# Patient Record
Sex: Female | Born: 1975 | Race: White | Hispanic: No | Marital: Married | Smoking: Former smoker
Health system: Southern US, Community
[De-identification: ages and names within clinical notes are randomized; demographics above are authoritative.]

## PROBLEM LIST (undated history)

## (undated) DIAGNOSIS — M5030 Other cervical disc degeneration, unspecified cervical region: Secondary | ICD-10-CM

## (undated) DIAGNOSIS — N809 Endometriosis, unspecified: Secondary | ICD-10-CM

## (undated) HISTORY — PX: BREAST ENHANCEMENT SURGERY: SHX7

## (undated) HISTORY — DX: Other cervical disc degeneration, unspecified cervical region: M50.30

## (undated) HISTORY — PX: RIGHT OOPHORECTOMY: SHX2359

## (undated) HISTORY — DX: Endometriosis, unspecified: N80.9

---

## 1999-05-20 HISTORY — PX: AUGMENTATION MAMMAPLASTY: SUR837

## 2006-07-06 ENCOUNTER — Ambulatory Visit: Payer: Self-pay | Admitting: Internal Medicine

## 2006-07-27 ENCOUNTER — Ambulatory Visit: Payer: Self-pay | Admitting: Obstetrics and Gynecology

## 2006-09-08 ENCOUNTER — Ambulatory Visit: Payer: Self-pay | Admitting: Obstetrics and Gynecology

## 2006-09-14 ENCOUNTER — Inpatient Hospital Stay: Payer: Self-pay | Admitting: Obstetrics and Gynecology

## 2009-10-03 ENCOUNTER — Emergency Department: Payer: Self-pay | Admitting: Emergency Medicine

## 2010-08-31 IMAGING — CT CT HEAD WITHOUT CONTRAST
2 series · 16 of 30 positions shown, 20 images · non-contrast
Comparison: none

REASON FOR EXAM: migraine / worst headache ever / pt in the [place]
COMMENTS:

PROCEDURE:     CT  - CT HEAD WITHOUT CONTRAST  - October 03, 2009  [DATE]
RESULT:     Comparison:  None
TECHNIQUE: Multiple axial images from the foramen magnum to the vertex were
obtained without IV contrast.

[Series 2: without · axial · non-contrast · 0.36mm/px · z∈[-132,-12]mm · 13 of 28 slices shown, 17 images]
[im 2/28  brain]
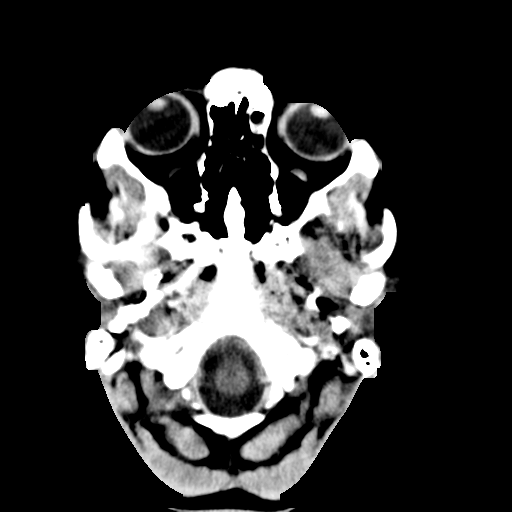
[im 2/28  bone]
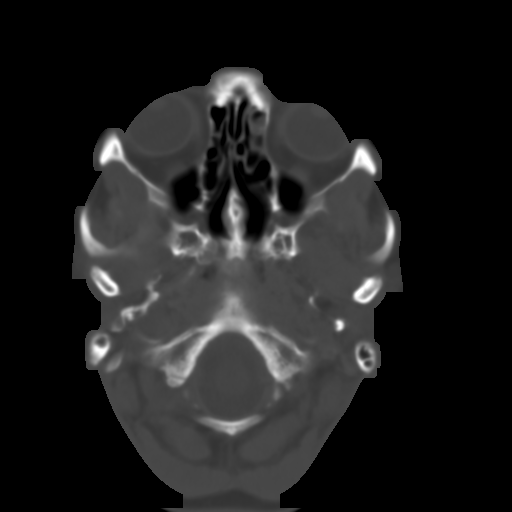
[im 4/28  brain]
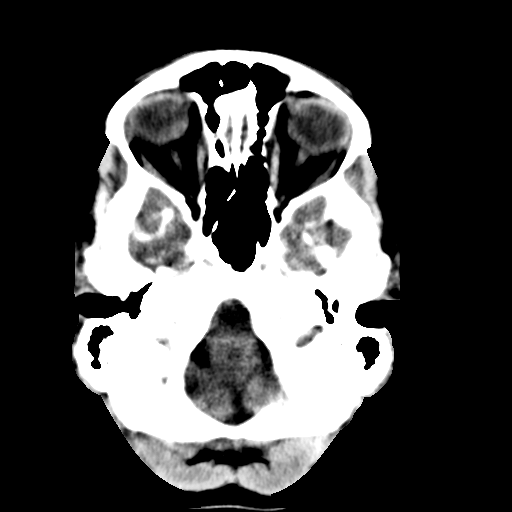
[im 6/28  brain]
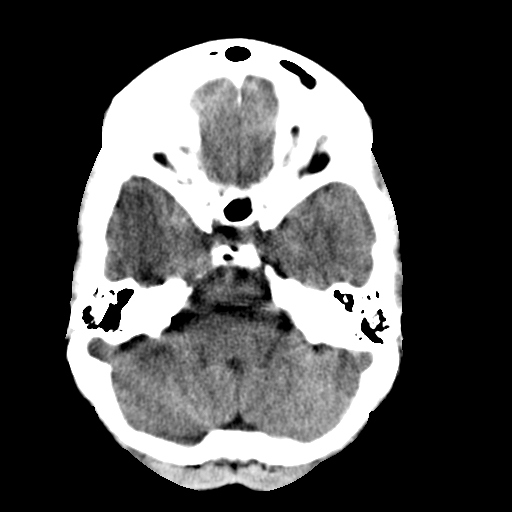
[im 8/28  brain]
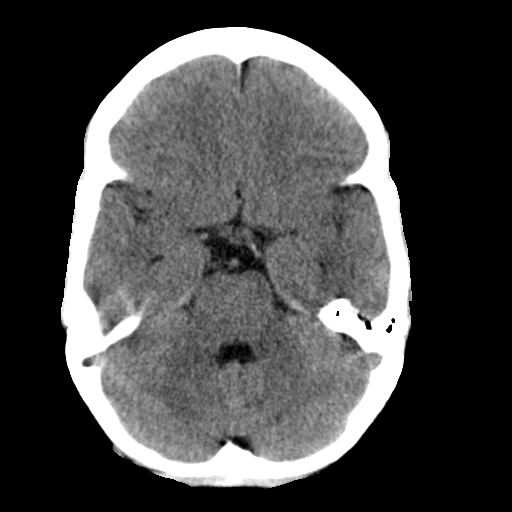
[im 10/28  brain]
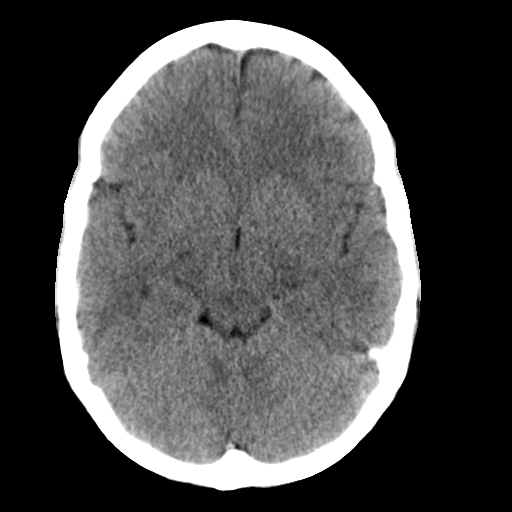
[im 10/28  bone]
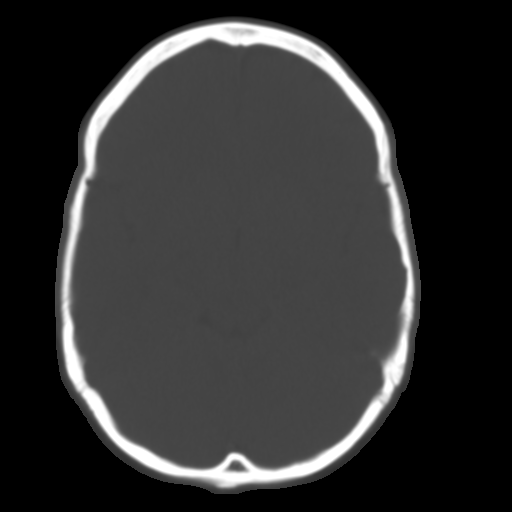
[im 12/28  brain]
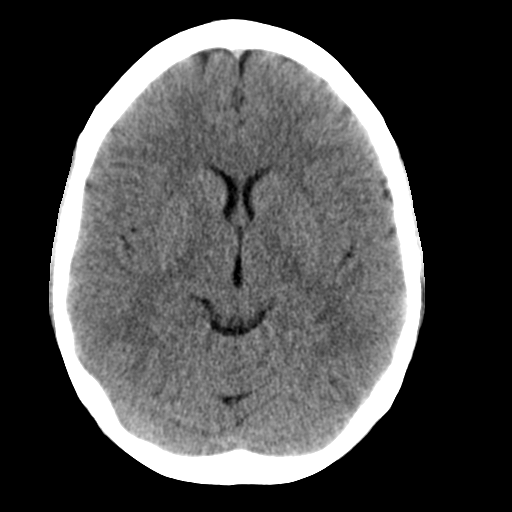
[im 14/28  brain]
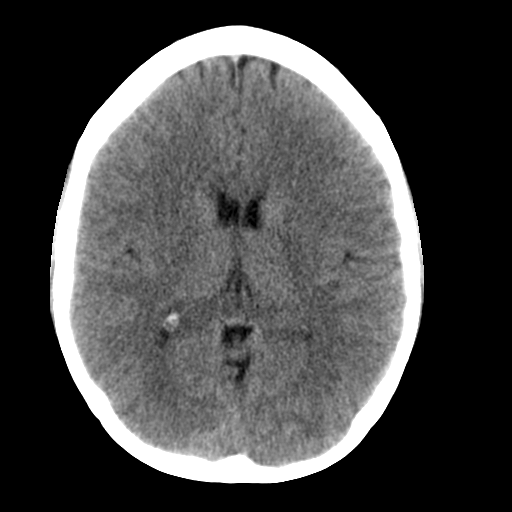
[im 16/28  brain]
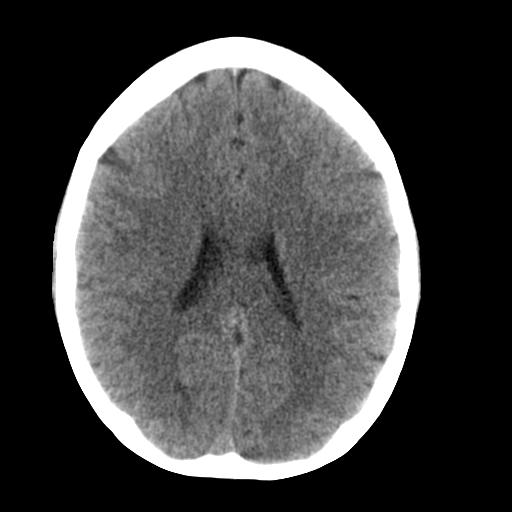
[im 18/28  brain]
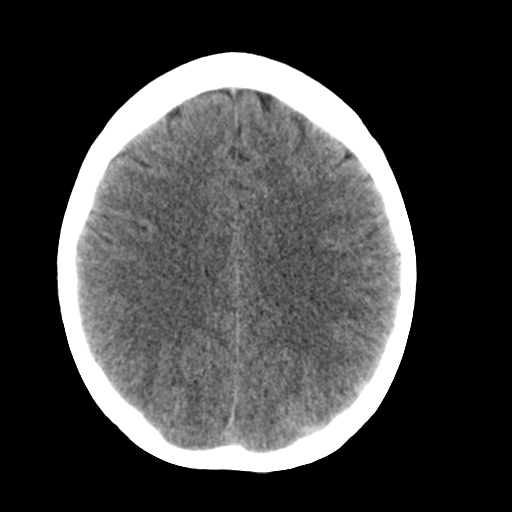
[im 18/28  bone]
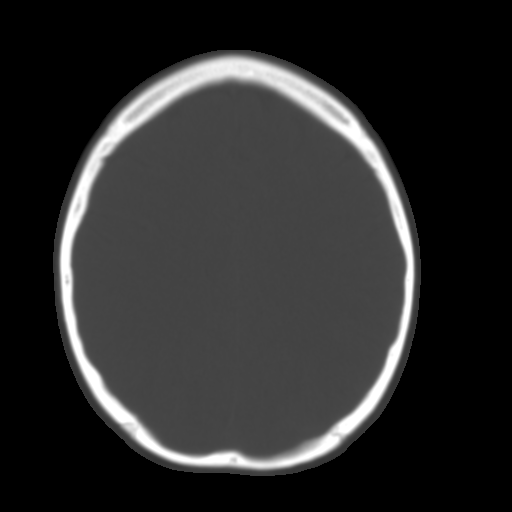
[im 20/28  brain]
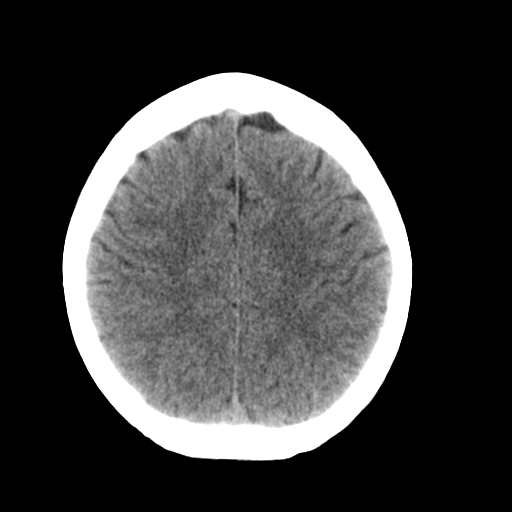
[im 22/28  brain]
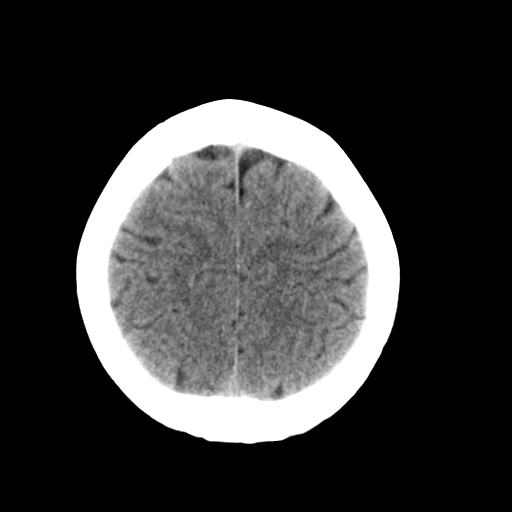
[im 24/28  brain]
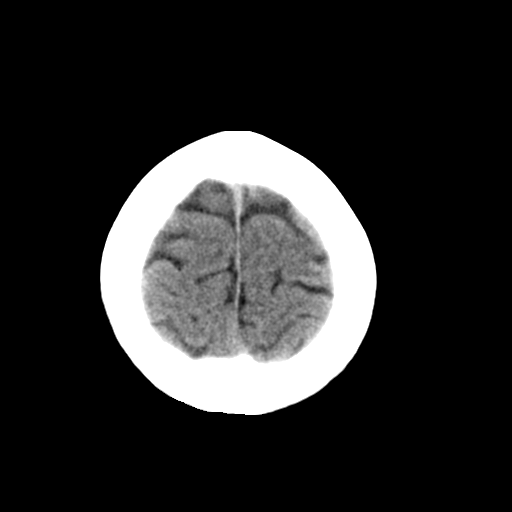
[im 26/28  brain]
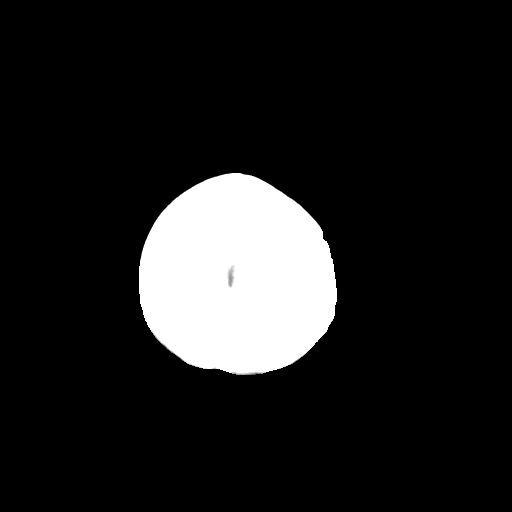
[im 26/28  bone]
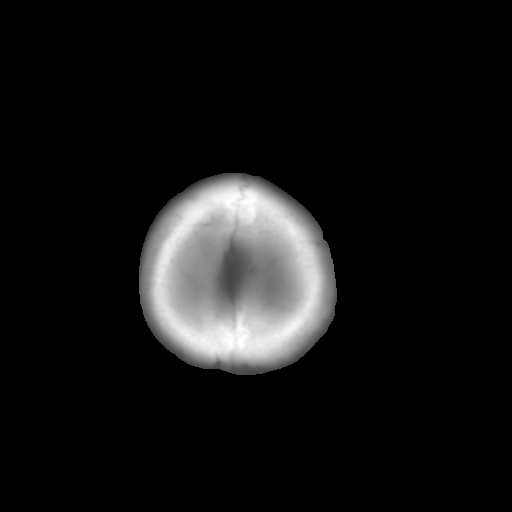

[Series 3: bone · axial · 0.36mm/px · z∈[-132,-92]mm · 3 of 28 slices shown]
[im 2/28  bone]
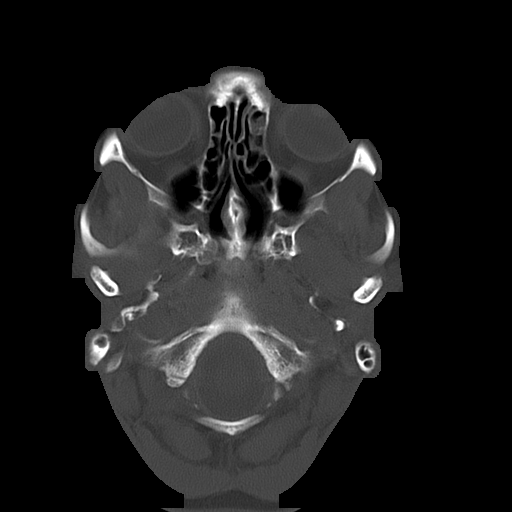
[im 6/28  bone]
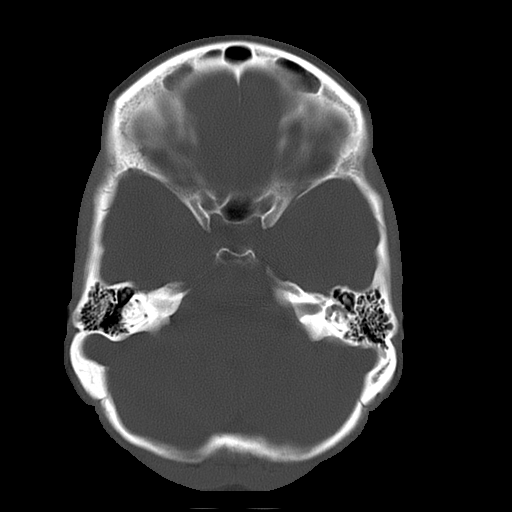
[im 10/28  bone]
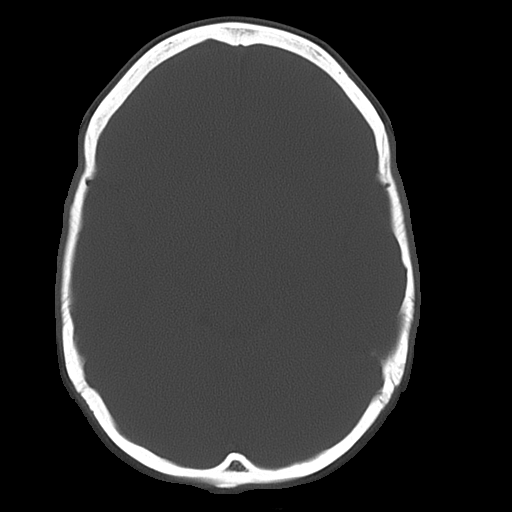

[16 of 30 positions shown; findings below may reference images not displayed]

FINDINGS: There is no evidence of mass effect, midline shift, or extra-axial fluid
collections.  There is no evidence of a space-occupying lesion or
intracranial hemorrhage. There is no evidence of a cortical-based area of
acute infarction.

The ventricles and sulci are appropriate for the patient's age. The basal
cisterns are patent.

Visualized portions of the orbits are unremarkable. The visualized portions
of the paranasal sinuses and mastoid air cells are unremarkable.

The osseous structures are unremarkable.
IMPRESSION: No acute intracranial process.

## 2014-09-12 ENCOUNTER — Ambulatory Visit
Admit: 2014-09-12 | Disposition: A | Discharge status: Home / Self Care | Payer: Self-pay | Attending: Family Medicine | Admitting: Family Medicine

## 2014-12-25 LAB — HM PAP SMEAR: HM PAP: NEGATIVE

## 2015-03-01 DIAGNOSIS — M5030 Other cervical disc degeneration, unspecified cervical region: Secondary | ICD-10-CM | POA: Insufficient documentation

## 2015-03-06 ENCOUNTER — Encounter: Payer: Self-pay | Admitting: Family Medicine

## 2015-03-06 ENCOUNTER — Ambulatory Visit (INDEPENDENT_AMBULATORY_CARE_PROVIDER_SITE_OTHER): Payer: Managed Care, Other (non HMO) | Admitting: Family Medicine

## 2015-03-06 VITALS — BP 118/75 | HR 70 | Temp 97.6°F | Ht 62.5 in | Wt 141.0 lb

## 2015-03-06 DIAGNOSIS — J342 Deviated nasal septum: Secondary | ICD-10-CM | POA: Diagnosis not present

## 2015-03-06 DIAGNOSIS — D721 Eosinophilia, unspecified: Secondary | ICD-10-CM

## 2015-03-06 DIAGNOSIS — R05 Cough: Secondary | ICD-10-CM

## 2015-03-06 DIAGNOSIS — H1010 Acute atopic conjunctivitis, unspecified eye: Secondary | ICD-10-CM | POA: Insufficient documentation

## 2015-03-06 DIAGNOSIS — R062 Wheezing: Secondary | ICD-10-CM | POA: Diagnosis not present

## 2015-03-06 DIAGNOSIS — R059 Cough, unspecified: Secondary | ICD-10-CM | POA: Insufficient documentation

## 2015-03-06 DIAGNOSIS — Z23 Encounter for immunization: Secondary | ICD-10-CM | POA: Diagnosis not present

## 2015-03-06 DIAGNOSIS — H1013 Acute atopic conjunctivitis, bilateral: Secondary | ICD-10-CM

## 2015-03-06 DIAGNOSIS — J309 Allergic rhinitis, unspecified: Secondary | ICD-10-CM | POA: Insufficient documentation

## 2015-03-06 DIAGNOSIS — J3081 Allergic rhinitis due to animal (cat) (dog) hair and dander: Secondary | ICD-10-CM | POA: Diagnosis not present

## 2015-03-06 MED ORDER — ALBUTEROL SULFATE HFA 108 (90 BASE) MCG/ACT IN AERS: 2.0000 | INHALATION_SPRAY | RESPIRATORY_TRACT | Status: DC | PRN

## 2015-03-06 MED ORDER — MONTELUKAST SODIUM 10 MG PO TABS: 10.0000 mg | ORAL_TABLET | Freq: Every day | ORAL | Status: DC

## 2015-03-06 NOTE — Assessment & Plan Note (Signed)
Encouraged patient to take daily plain antihistamine such as generic claritin or generic allegra; avoid the decongestant; start singulair; avoid triggers, including keeping animals off the bed and consider replacing carpet in the bedroom with hardwood floors; allergy testing ordered; if we can control the allergies, we should see improvement in the respiratory symptoms

## 2015-03-06 NOTE — Assessment & Plan Note (Signed)
Explained finding; would be willing to refer to ENT for evaluation, consideration of repair if not able to breathe through the right nostril well

## 2015-03-06 NOTE — Assessment & Plan Note (Signed)
Encouraged patient to take daily plain antihistamine such as generic claritin or generic allegra; avoid the decongestant; start singulair; avoid triggers; allergy testing ordered; if we can control the allergies, we should see improvement in the respiratory symptoms

## 2015-03-06 NOTE — Assessment & Plan Note (Signed)
Noted on previous CBC from 2013; mild elevation of 0.5; recheck CBC today

## 2015-03-06 NOTE — Progress Notes (Signed)
BP 118/75 mmHg  Pulse 70  Temp(Src) 97.6 F (36.4 C)  Ht 5' 2.5" (1.588 m)  Wt 141 lb (63.957 kg)  BMI 25.36 kg/m2  SpO2 97%   Subjective:    Patient ID: Sandra Wright, female    DOB: [birth year], 39 y.o.   MRN: [id number]  HPI: Sandra Wright is a 39 y.o. female  Chief Complaint  Patient presents with  . Cough    persistant cough and wheezing. Wakes her up in the middle of the night. x 3 months. Some mucus when she coughs.   She does a lot of sneezing and cough; thought it was allergies; took some allergy medicine and cold medicine Never had allergy testing done Thinking more things are bothering her, more sneezing and coughing and itchy eyes She is waking up in the middle of the night coughing, trouble breathing, lasts 1-1/2 hours; like she is trying to breathe through a small straw High-pitched wheezing sounds Never had asthma before; sounds like it She has some mucous clear in color No fevers, little bit of night sweats, gets hot when she is coughing, hot flashy Chest pain with coughing No known family hx of asthma Everybody has allergies Bedroom is carpeted; dogs (two) and a cat will nap with her, but they sleep in the crate at night; they will get up on the bed during the day; propane heat, but started before heat came on No potted plants in the bedroom No throw pillows or stuffed animals Washes sheets once a week Husband is allergic to dust mites, does a lot of dusting and vacuuming No special filters She does smoke from time to time, and that makes her symptoms worse  Relevant past medical, surgical, family and social history reviewed and updated as indicated. Interim medical history since our last visit reviewed. Allergies and medications reviewed and updated.  Review of Systems  Per HPI unless specifically indicated above     Objective:    BP 118/75 mmHg  Pulse 70  Temp(Src) 97.6 F (36.4 C)  Ht 5' 2.5" (1.588 m)  Wt 141 lb (63.957 kg)  BMI 25.36  kg/m2  SpO2 97%  Wt Readings from Last 3 Encounters:  03/06/15 141 lb (63.957 kg)  09/12/14 146 lb (66.225 kg)    Physical Exam  Constitutional: She appears well-developed and well-nourished. No distress.  HENT:  Head: Normocephalic and atraumatic.  Right Ear: Hearing, external ear and ear canal normal. Tympanic membrane is not injected, not scarred and not erythematous. A middle ear effusion is present.  Left Ear: Hearing, external ear and ear canal normal. Tympanic membrane is not injected, not scarred and not erythematous. A middle ear effusion is present.  Nose: Rhinorrhea and septal deviation (with narrowing of right nostril) present. No mucosal edema.  Mouth/Throat: Oropharynx is clear and moist and mucous membranes are normal. Mucous membranes are not dry. No oropharyngeal exudate, posterior oropharyngeal edema or posterior oropharyngeal erythema.  Eyes: Conjunctivae and EOM are normal. Right eye exhibits no discharge and no exudate. Left eye exhibits no discharge and no exudate. Right conjunctiva is not injected. Left conjunctiva is not injected. No scleral icterus.  Neck: No thyromegaly present.  Cardiovascular: Normal rate, regular rhythm and normal heart sounds.   No murmur heard. Pulmonary/Chest: Effort normal and breath sounds normal. No respiratory distress. She has no wheezes.  Abdominal: She exhibits no distension.  Musculoskeletal: Normal range of motion. She exhibits no edema.  Lymphadenopathy:  Head (right side): No submandibular adenopathy present.       Head (left side): No submandibular adenopathy present.    She has no cervical adenopathy.       Right: No supraclavicular adenopathy present.       Left: No supraclavicular adenopathy present.  Neurological: She is alert.  Skin: Skin is warm and dry. She is not diaphoretic. No pallor.  No pallor of nailbeds  Psychiatric: She has a normal mood and affect. Her behavior is normal. Judgment and thought content  normal.    No results found for this or any previous visit.    Assessment & Plan:   Problem List Items Addressed This Visit      Respiratory   Deviated nasal septum    Explained finding; would be willing to refer to ENT for evaluation, consideration of repair if not able to breathe through the right nostril well      Allergic rhinitis    Encouraged patient to take daily plain antihistamine such as generic claritin or generic allegra; avoid the decongestant; start singulair; avoid triggers, including keeping animals off the bed and consider replacing carpet in the bedroom with hardwood floors; allergy testing ordered; if we can control the allergies, we should see improvement in the respiratory symptoms      Relevant Orders   Allergens, Zone 2     Other   Wheezing - Primary    Spirometry done which was normal here; however, her history is strongly suggestive of extrinsic asthma, reactive airways disease; use SABA if needed; start singulair; return in 2 weeks and if not improving, then consider adding daily controller; decided against CXR today, discussed with patient, but will consider if symptoms persist; avoid triggers such as pets on the bed, smoking, etc.      Relevant Orders   Spirometry with graph (Completed)   Allergens, Zone 2   Needs flu shot    Flu vaccine offered and given today      Cough    Suggestive of allergic asthma, reactive airways disease; avoid smoking, keep pets off of the bed, replace carpet with hardwood floors if able; considered CXR but decided against that right now, considering lifetime radiation risk; will get CXR soon if not improving; she agrees; start singulair and SABA; return in 2 weeks      Relevant Orders   CBC with Differential/Platelet   Allergens, Zone 2   Eosinophilia    Noted on previous CBC from 2013; mild elevation of 0.5; recheck CBC today      Relevant Orders   CBC with Differential/Platelet   Allergic conjunctivitis     Encouraged patient to take daily plain antihistamine such as generic claritin or generic allegra; avoid the decongestant; start singulair; avoid triggers; allergy testing ordered; if we can control the allergies, we should see improvement in the respiratory symptoms       Other Visit Diagnoses    Encounter for immunization            Follow up plan: No Follow-up on file. Return in 2 weeks Orders Placed This Encounter  Procedures  . Flu Vaccine QUAD 36+ mos IM  . CBC with Differential/Platelet  . Allergens, Zone 2  . Spirometry with graph   Meds ordered this encounter  Medications  . montelukast (SINGULAIR) 10 MG tablet    Sig: Take 1 tablet (10 mg total) by mouth at bedtime.    Dispense:  30 tablet    Refill:  5  .  albuterol (PROVENTIL HFA) 108 (90 BASE) MCG/ACT inhaler    Sig: Inhale 2 puffs into the lungs every 4 (four) hours as needed for wheezing or shortness of breath.    Dispense:  1 Inhaler    Refill:  1   An after-visit summary was printed and given to the patient at check-out.  Please see the patient instructions which may contain other information and recommendations beyond what is mentioned above in the assessment and plan.

## 2015-03-06 NOTE — Assessment & Plan Note (Signed)
Suggestive of allergic asthma, reactive airways disease; avoid smoking, keep pets off of the bed, replace carpet with hardwood floors if able; considered CXR but decided against that right now, considering lifetime radiation risk; will get CXR soon if not improving; she agrees; start singulair and SABA; return in 2 weeks

## 2015-03-06 NOTE — Assessment & Plan Note (Signed)
Spirometry done which was normal here; however, her history is strongly suggestive of extrinsic asthma, reactive airways disease; use SABA if needed; start singulair; return in 2 weeks and if not improving, then consider adding daily controller; decided against CXR today, discussed with patient, but will consider if symptoms persist; avoid triggers such as pets on the bed, smoking, etc.

## 2015-03-06 NOTE — Assessment & Plan Note (Signed)
Flu vaccine offered and given today 

## 2015-03-07 LAB — CBC WITH DIFFERENTIAL/PLATELET
BASOS ABS: 0.1 10*3/uL (ref 0.0–0.2)
Basos: 1 %
EOS (ABSOLUTE): 1 10*3/uL — ABNORMAL HIGH (ref 0.0–0.4)
Eos: 8 %
HEMOGLOBIN: 13.3 g/dL (ref 11.1–15.9)
Hematocrit: 40.5 % (ref 34.0–46.6)
Immature Grans (Abs): 0 10*3/uL (ref 0.0–0.1)
Immature Granulocytes: 0 %
LYMPHS ABS: 4.6 10*3/uL — AB (ref 0.7–3.1)
Lymphs: 38 %
MCH: 30.9 pg (ref 26.6–33.0)
MCHC: 32.8 g/dL (ref 31.5–35.7)
MCV: 94 fL (ref 79–97)
Monocytes Absolute: 0.5 10*3/uL (ref 0.1–0.9)
Monocytes: 4 %
Neutrophils Absolute: 5.8 10*3/uL (ref 1.4–7.0)
Neutrophils: 49 %
Platelets: 436 10*3/uL — ABNORMAL HIGH (ref 150–379)
RBC: 4.31 x10E6/uL (ref 3.77–5.28)
RDW: 12.6 % (ref 12.3–15.4)
WBC: 12 10*3/uL — ABNORMAL HIGH (ref 3.4–10.8)

## 2015-03-09 LAB — ALLERGENS, ZONE 2
Alternaria Alternata IgE: <0.1 kU/L
Amer Sycamore IgE Qn: <0.1 kU/L
Aspergillus Fumigatus IgE: <0.1 kU/L
Bermuda Grass IgE: <0.1 kU/L
Cat Dander IgE: <0.1 kU/L
Cedar, Mountain IgE: <0.1 kU/L
Cladosporium Herbarum IgE: <0.1 kU/L
Cockroach, American IgE: 0.14 kU/L — AB
D001-IGE D PTERONYSSINUS: 0.2 kU/L — AB
D002-IGE D FARINAE: 0.15 kU/L — AB
Dog Dander IgE: <0.1 kU/L
Elm, American IgE: <0.1 kU/L
Johnson Grass IgE: <0.1 kU/L
Maple/Box Elder IgE: <0.1 kU/L
Penicillium Chrysogen IgE: <0.1 kU/L
Ragweed, Short IgE: <0.1 kU/L
Stemphylium Herbarum IgE: <0.1 kU/L
Sweet gum IgE RAST Ql: <0.1 kU/L
Timothy Grass IgE: <0.1 kU/L
White Mulberry IgE: <0.1 kU/L

## 2015-03-15 ENCOUNTER — Encounter: Payer: Self-pay | Admitting: Family Medicine

## 2015-03-15 ENCOUNTER — Other Ambulatory Visit: Payer: Self-pay | Admitting: Family Medicine

## 2015-03-15 DIAGNOSIS — D721 Eosinophilia, unspecified: Secondary | ICD-10-CM

## 2015-03-15 DIAGNOSIS — J3089 Other allergic rhinitis: Secondary | ICD-10-CM

## 2015-03-15 NOTE — Assessment & Plan Note (Signed)
See allergy testing; refer to allergist

## 2015-03-15 NOTE — Assessment & Plan Note (Signed)
Refer to allergist  

## 2015-09-11 ENCOUNTER — Ambulatory Visit: Payer: Managed Care, Other (non HMO) | Admitting: Family Medicine

## 2015-09-13 ENCOUNTER — Ambulatory Visit: Payer: Managed Care, Other (non HMO) | Admitting: Family Medicine

## 2015-09-17 ENCOUNTER — Encounter: Payer: Self-pay | Admitting: Family Medicine

## 2015-09-17 ENCOUNTER — Ambulatory Visit (INDEPENDENT_AMBULATORY_CARE_PROVIDER_SITE_OTHER): Payer: Managed Care, Other (non HMO) | Admitting: Family Medicine

## 2015-09-17 VITALS — BP 134/78 | HR 64 | Temp 98.0°F | Ht 63.0 in | Wt 146.0 lb

## 2015-09-17 DIAGNOSIS — N644 Mastodynia: Secondary | ICD-10-CM

## 2015-09-17 DIAGNOSIS — S248XXA Injury of other specified nerves of thorax, initial encounter: Secondary | ICD-10-CM

## 2015-09-17 DIAGNOSIS — R1900 Intra-abdominal and pelvic swelling, mass and lump, unspecified site: Secondary | ICD-10-CM

## 2015-09-17 NOTE — Patient Instructions (Signed)
Scapular Winging With Rehab Scapular winging syndrome is also known as serratus anterior palsy or long thoracic nerve injury. The condition is an uncommon injury to the nervous system. The condition is caused by injury to the long thoracic nerve that runs through the neck and shoulder. Injury to the shoulder, such as a fall or repetitive stress on the shoulder causes the nerve to become stretched. Occasionally the injury is the result of an infection of the nerve. Damage to the long thoracic nerve results in weakness of the serratus anterior muscle. The serratus anterior muscle is responsible for controlling the shoulder blade (scapula). Weakness in this muscle results in a instability (winging) of the scapula. SYMPTOMS   Pain and weakness in the shoulder (usually the back of the shoulder) that is often diffuse or unable to localize.  Loss of or decrease in shoulder function.  Upper back pain while sitting, due to the scapula pressing on the back of the chair.  Visible deformity in the back of the shoulder. CAUSES  Scapular winging is caused by stretching of the long thoracic nerve. Common mechanisms of injury include:  Viral illness.  Repetitive and/or stressful use of the shoulder.  Falling onto the shoulder with the head and neck stretched away from the shoulder. RISK INCREASES WITH:  Contact sports (football, rugby, lacrosse, or soccer).  Activities involving overhead arm movement (baseball, volleyball, or racquet sports).  Poor strength and flexibility. PREVENTION  Warm up and stretch properly before activity.  Allow for adequate recovery between workouts.  Maintain physical fitness:  Strength, flexibility, and endurance.  Cardiovascular fitness.  Learn and use proper technique. When possible, have a coach correct improper technique. PROGNOSIS  Scapular winging normally resolves spontaneously within 18 months. In rare circumstances surgery is recommended.  RELATED  COMPLICATIONS   Permanent nerve damage, including pain, numbness, tingle, or weakness.  Shoulder weakness.  Recurrent shoulder pain.  Inability to compete in athletics. TREATMENT Treatment initially involves resting from any activities that aggravate your symptoms. The use of ice and medication may help reduce pain and inflammation. The use of strengthening and stretching exercises may help reduce pain with activity, specifically shoulder exercises that improve range of motion. These exercises may be performed at home or with referral to a therapist. If symptoms persist for greater than 6 months despite non-surgical (conservative) treatment, then surgery may be recommended. Surgery is only used for the most serious cases and the purpose is to regain function, not to allow an athlete to return to sports. MEDICATION   If pain medication is necessary, then nonsteroidal anti-inflammatory medications, such as aspirin and ibuprofen, or other minor pain relievers, such as acetaminophen, are often recommended.  Do not take pain medication for 7 days before surgery.  Prescription pain relievers may be given if deemed necessary by your caregiver. Use only as directed and only as much as you need. HEAT AND COLD  Cold treatment (icing) relieves pain and reduces inflammation. Cold treatment should be applied for 10 to 15 minutes every 2 to 3 hours for inflammation and pain and immediately after any activity that aggravates your symptoms. Use ice packs or massage the area with a piece of ice (ice massage).  Heat treatment may be used prior to performing the stretching and strengthening activities prescribed by your caregiver, physical therapist, or athletic trainer. Use a heat pack or soak the injury in warm water. SEEK MEDICAL CARE IF:  Treatment seems to offer no benefit, or the condition worsens.  Any medications   produce adverse side effects. EXERCISES  RANGE OF MOTION (ROM) AND STRETCHING  EXERCISES - Scapular Winging (Serratus Anterior Palsy, Long Thoracic Nerve Injury)  These exercises may help you when beginning to rehabilitate your injury. Your symptoms may resolve with or without further involvement from your physician, physical therapist or athletic trainer. While completing these exercises, remember:   Restoring tissue flexibility helps normal motion to return to the joints. This allows healthier, less painful movement and activity.  An effective stretch should be held for at least 30 seconds.  A stretch should never be painful. You should only feel a gentle lengthening or release in the stretched tissue. ROM - Pendulum  Bend at the waist so that your right / left arm falls away from your body. Support yourself with your opposite hand on a solid surface, such as a table or a countertop.  Your right / left arm should be perpendicular to the ground. If it is not perpendicular, you need to lean over farther. Relax the muscles in your right / left arm and shoulder as much as possible.  Gently sway your hips and trunk so they move your right / left arm without any use of your right / left shoulder muscles.  Progress your movements so that your right / left arm moves side to side, then forward and backward, and finally, both clockwise and counterclockwise.  Complete __________ repetitions in each direction. Many people use this exercise to relieve discomfort in their shoulder as well as to gain range of motion. Repeat __________ times. Complete this exercise __________ times per day. STRETCH - Flexion, Seated   Sit in a firm chair so that your right / left forearm can rest on a table or on a table or countertop. Your right / left elbow should rest below the height of your shoulder so that your shoulder feels supported and not tense or uncomfortable.  Keeping your right / left shoulder relaxed, lean forward at your waist, allowing your right / left hand to slide forward. Bend  forward until you feel a moderate stretch in your shoulder, but before you feel an increase in your pain.  Hold __________ seconds. Slowly return to your starting position. Repeat __________ times. Complete this exercise __________ times per day.  STRETCH - Flexion, Standing  Stand with good posture. With an underhand grip on your right / left and an overhand grip on the opposite hand, grasp a broomstick or cane so that your hands are a little more than shoulder-width apart.  Keeping your right / left elbow straight and shoulder muscles relaxed, push the stick with your opposite hand to raise your right / left arm in front of your body and then overhead. Raise your arm until you feel a stretch in your right / left shoulder, but before you have increased shoulder pain.  Avoid shrugging your right / left shoulder as your arm rises by keeping your shoulder blade tucked down and toward your mid-back spine. Hold __________ seconds.  Slowly return to the starting position. Repeat __________ times. Complete this exercise __________ times per day. STRETCH - Abduction, Supine  Stand with good posture. With an underhand grip on your right / left and an overhand grip on the opposite hand, grasp a broomstick or cane so that your hands are a little more than shoulder-width apart.  Keeping your right / left elbow straight and shoulder muscles relaxed, push the stick with your opposite hand to raise your right / left arm out to   the side of your body and then overhead. Raise your arm until you feel a stretch in your right / left shoulder, but before you have increased shoulder pain.  Avoid shrugging your right / left shoulder as your arm rises by keeping your shoulder blade tucked down and toward your mid-back spine. Hold __________ seconds.  Slowly return to the starting position. Repeat __________ times. Complete this exercise __________ times per day. ROM - Flexion, Active-Assisted  Lie on your back.  You may bend your knees for comfort.  Grasp a broomstick or cane so your hands are about shoulder-width apart. Your right / left hand should grip the end of the stick/cane so that your hand is positioned "thumbs-up," as if you were about to shake hands.  Using your healthy arm to lead, raise your right / left arm overhead until you feel a gentle stretch in your shoulder. Hold __________ seconds.  Use the stick/cane to assist in returning your right / left arm to its starting position. Repeat __________ times. Complete this exercise __________ times per day.  STRENGTHENING EXERCISES - Scapular Winging (Serratus Anterior Palsy, Long Thoracic Nerve Injury) These exercises may help you when beginning to rehabilitate your injury. They may resolve your symptoms with or without further involvement from your physician, physical therapist or athletic trainer. While completing these exercises, remember:   Muscles can gain both the endurance and the strength needed for everyday activities through controlled exercises.  Complete these exercises as instructed by your physician, physical therapist or athletic trainer. Progress with the resistance and repetition exercises only as your caregiver advises.  You may experience muscle soreness or fatigue, but the pain or discomfort you are trying to eliminate should never worsen during these exercises. If this pain does worsen, stop and make certain you are following the directions exactly. If the pain is still present after adjustments, discontinue the exercise until you can discuss the trouble with your clinician.  During your recovery, avoid activity or exercises which involve actions that place your injured hand or elbow above your head or behind your back or head. These positions stress the tissues which are trying to heal. STRENGTH - Scapular Depression and Adduction   With good posture, sit on a firm chair. Supported your arms in front of you with pillows,  arm rests or a table top. Have your elbows in line with the sides of your body.  Gently draw your shoulder blades down and toward your mid-back spine. Gradually increase the tension without tensing the muscles along the top of your shoulders and the back of your neck.  Hold for __________ seconds. Slowly release the tension and relax your muscles completely before completing the next repetition.  After you have practiced this exercise, remove the arm support and complete it in standing as well as sitting. Repeat __________ times. Complete this exercise __________ times per day.  STRENGTH - Scapular Protractors, Standing   Stand arms-length away from a wall. Place your hands on the wall, keeping your elbows straight.  Begin by dropping your shoulder blades down and toward your mid-back spine.  To strengthen your protractors, keep your shoulder blades down, but slide them forward on your rib cage. It will feel as if you are lifting the back of your rib cage away from the wall. This is a subtle motion and can be challenging to complete. Ask your clinician for further instruction if you are not sure you are doing the exercise correctly.  Hold for __________ seconds.   Slowly return to the starting position, resting the muscles completely before completing the next repetition. Repeat __________ times. Complete this exercise __________ times per day. STRENGTH - Scapular Protractors, Supine  Lie on your back on a firm surface. Extend your right / left arm straight into the air while holding a __________ weight in your hand.  Keeping your head and back in place, lift your shoulder off the floor.  Hold __________ seconds. Slowly return to the starting position and allow your muscles to relax completely before completing the next repetition. Repeat __________ times. Complete this exercise __________ times per day. STRENGTH - Scapular Protractors, Quadruped  Get onto your hands and knees with your  shoulders directly over your hands (or as close as you comfortably can be).  Keeping your elbows locked, lift the back of your rib cage up into your shoulder blades so your mid-back rounds-out. Keep your neck muscles relaxed.  Hold this position for __________ seconds. Slowly return to the starting position and allow your muscles to relax completely before completing the next repetition. Repeat __________ times. Complete this exercise __________ times per day.  STRENGTH - Scapular Depressors  Keeping your feet on the floor, lift your bottom from the seat and lock your elbows.  Keeping your elbows straight, allow gravity to pull your body weight down. Your shoulders will rise toward your ears.  Raise your body against gravity by drawing your shoulder blades down your back, shortening the distance between your shoulders and ears. Although your feet should always maintain contact with the floor, your feet should progressively support less body weight as you get stronger.  Hold __________ seconds. In a controlled and slow manner, lower your body weight to begin the next repetition. Repeat __________ times. Complete this exercise __________ times per day.  STRENGTH - Shoulder Extensors, Prone  Lie on your stomach on a firm surface so that your right / left arm overhangs the edge. Rest your forehead on your opposite forearm. With your thumb facing away from your body and your elbow straight, hold a __________ weight in your hand.  Squeeze your right / left shoulder blade to your mid-back spine and then slowly raise your arm behind you to the height of the bed.  Hold for __________ seconds. Slowly reverse the directions and return to the starting position, controlling the weight as you lower your arm. Repeat __________ times. Complete this exercise __________ times per day.  STRENGTH - Horizontal Abductors Choose one of the two oppositions to complete this exercise. Prone: lying on stomach:  Lie  on your stomach on a firm surface so that your right / left arm overhangs the edge. Rest your forehead on your opposite forearm. With your palm facing the floor and your elbow straight, hold a __________ weight in your hand.  Squeeze your right / left shoulder blade to your mid-back spine and then slowly raise your arm to the height of the bed.  Hold for __________ seconds. Slowly reverse the directions and return to the starting position, controlling the weight as you lower your arm. Repeat __________ times. Complete this exercise __________ times per day. Standing:  Secure a rubber exercise band/tubing so that it is at the height of your shoulders when you are either standing or sitting on a firm arm-less chair.  Grasp an end of the band/tubing in each hand and have your palms face each other. Straighten your elbows and lift your hands straight in front of you at shoulder height. Step back   away from the secured end of band/tubing until it becomes tense.  Squeeze your shoulder blades together. Keeping your elbows locked and your hands at shoulder-height, bring your hands out to your side.  Hold __________ seconds. Slowly ease the tension on the band/tubing as you reverse the directions and return to the starting position. Repeat __________ times. Complete this exercise __________ times per day. STRENGTH - Scapular Retractors  Secure a rubber exercise band/tubing so that it is at the height of your shoulders when you are either standing or sitting on a firm arm-less chair.  With a palm-down grip, grasp an end of the band/tubing in each hand. Straighten your elbows and lift your hands straight in front of you at shoulder height. Step back away from the secured end of band/tubing until it becomes tense.  Squeezing your shoulder blades together, draw your elbows back as you bend them. Keep your upper arm lifted away from your body throughout the exercise.  Hold __________ seconds. Slowly ease  the tension on the band/tubing as you reverse the directions and return to the starting position. Repeat __________ times. Complete this exercise __________ times per day. STRENGTH - Shoulder Extensors   Secure a rubber exercise band/tubing so that it is at the height of your shoulders when you are either standing or sitting on a firm arm-less chair.  With a thumbs-up grip, grasp an end of the band/tubing in each hand. Straighten your elbows and lift your hands straight in front of you at shoulder height. Step back away from the secured end of band/tubing until it becomes tense.  Squeezing your shoulder blades together, pull your hands down to the sides of your thighs. Do not allow your hands to go behind you.  Hold for __________ seconds. Slowly ease the tension on the band/tubing as you reverse the directions and return to the starting position. Repeat __________ times. Complete this exercise __________ times per day.  STRENGTH - Scapular Retractors and External Rotators  Secure a rubber exercise band/tubing so that it is at the height of your shoulders when you are either standing or sitting on a firm arm-less chair.  With a palm-down grip, grasp an end of the band/tubing in each hand. Bend your elbows 90 degrees and lift your elbows to shoulder height at your sides. Step back away from the secured end of band/tubing until it becomes tense.  Squeezing your shoulder blades together, rotate your shoulder so that your upper arm and elbow remain stationary, but your fists travel upward to head-height.  Hold __________ for seconds. Slowly ease the tension on the band/tubing as you reverse the directions and return to the starting position. Repeat __________ times. Complete this exercise __________ times per day.  STRENGTH - Scapular Retractors and External Rotators, Rowing  Secure a rubber exercise band/tubing so that it is at the height of your shoulders when you are either standing or sitting  on a firm arm-less chair.  With a palm-down grip, grasp an end of the band/tubing in each hand. Straighten your elbows and lift your hands straight in front of you at shoulder height. Step back away from the secured end of band/tubing until it becomes tense.  Step 1: Squeeze your shoulder blades together. Bending your elbows, draw your hands to your chest as if you are rowing a boat. At the end of this motion, your hands and elbow should be at shoulder-height and your elbows should be out to your sides.  Step 2: Rotate your shoulder   to raise your hands above your head. Your forearms should be vertical and your upper-arms should be horizontal.  Hold for __________ seconds. Slowly ease the tension on the band/tubing as you reverse the directions and return to the starting position. Repeat __________ times. Complete this exercise __________ times per day.  STRENGTH - Scapular Retractors and Elevators  Secure a rubber exercise band/tubing so that it is at the height of your shoulders when you are either standing or sitting on a firm arm-less chair.  With a thumbs-up grip, grasp an end of the band/tubing in each hand. Step back away from the secured end of band/tubing until it becomes tense.  Squeezing your shoulder blades together, straighten your elbows and lift your hands straight over your head.  Hold for __________ seconds. Slowly ease the tension on the band/tubing as you reverse the directions and return to the starting position. Repeat __________ times. Complete this exercise __________ times per day.    This information is not intended to replace advice given to you by your health care provider. Make sure you discuss any questions you have with your health care provider.   Document Released: 05/05/2005 Document Revised: 09/19/2014 Document Reviewed: 08/17/2008 [employer] Patient Education 2016 [employer]  

## 2015-09-17 NOTE — Progress Notes (Signed)
BP 134/78 mmHg  Pulse 64  Temp(Src) 98 F (36.7 C)  Ht  (1.6 m)  Wt 146 lb (66.225 kg)  BMI 25.87 kg/m2  SpO2 97%   Subjective:    Patient ID: Sandra Wright, female    DOB: Sep 03, 1975, 40 y.o.   MRN: [id number]  HPI: Sandra Wright is a 40 y.o. female  Chief Complaint  Patient presents with  . Shoulder Pain    right shoulder; no known injury but she does lift weights  . Knot in Abdomen    has been elvauated here before, did not get u/s at the time, but would like to have it done  . Breast Pain    left breast pain burning that comes and goes x 3 months. she does have breast implants, wonders if they could be leaking   SHOULDER PAIN Duration: Since February, has been seeing Massage therapist and that has been helping, Not having pain any more but not moving the way she is supposed to  Involved shoulder: right Mechanism of injury: unknown Location: posterior Onset:sudden Severity: severe  Quality:  tingly pain Frequency: constant, eased up later that day Radiation: no Aggravating factors: nothing   Alleviating factors: massage therapy   Status: better Treatments attempted: massage therapy, ice, heat and ibuprofen  Relief with NSAIDs?:  mild Weakness: yes Numbness: yes Decreased grip strength: no Redness: no Swelling: no Bruising: no Fevers: no  ABDOMINAL ISSUES- has had some cysts in the past, has a spot on her belly that hasn't gone away and has been uncomfortable Duration: 1+ years Nature: aching occasionally Location: epigastric  Severity: mild  Radiation: no Frequency: intermittent Alleviating factors: nothing Aggravating factors: nothing Treatments attempted: none Constipation: no Diarrhea: no Mucous in the stool: no Heartburn: yes Bloating:no Flatulence: no Nausea: no Vomiting: no Melena or hematochezia: no Rash: no Jaundice: no Fever: no Weight loss: no  BREAST PAIN Duration: 3 months Location: left lower breast Onset:  gradual Severity: moderate Quality: burning Frequency: intermittent Redness: no Swelling: no Trauma: no trauma Breastfeeding: no Associated with menstral cycle: no Nipple discharge: no Breast lump: no Status: fluctuating Treatments attempted: ibuprofen Previous mammogram: no  Relevant past medical, surgical, family and social history reviewed and updated as indicated. Interim medical history since our last visit reviewed. Allergies and medications reviewed and updated.  Review of Systems  Constitutional: Negative.   Respiratory: Negative.   Cardiovascular: Negative.   Gastrointestinal: Negative for nausea, vomiting, abdominal pain, diarrhea, constipation, blood in stool, abdominal distention, anal bleeding and rectal pain.  Musculoskeletal: Positive for myalgias and arthralgias. Negative for back pain, joint swelling, gait problem, neck pain and neck stiffness.  Psychiatric/Behavioral: Negative.     Per HPI unless specifically indicated above     Objective:    BP 134/78 mmHg  Pulse 64  Temp(Src) 98 F (36.7 C)  Ht  (1.6 m)  Wt 146 lb (66.225 kg)  BMI 25.87 kg/m2  SpO2 97%  Wt Readings from Last 3 Encounters:  09/17/15 146 lb (66.225 kg)  03/06/15 141 lb (63.957 kg)  09/12/14 146 lb (66.225 kg)    Physical Exam  Constitutional: She is oriented to person, place, and time. She appears well-developed and well-nourished. No distress.  HENT:  Head: Normocephalic and atraumatic.  Right Ear: Hearing normal.  Left Ear: Hearing normal.  Nose: Nose normal.  Eyes: Conjunctivae and lids are normal. Right eye exhibits no discharge. Left eye exhibits no discharge. No scleral icterus.  Pulmonary/Chest: Effort normal.  No respiratory distress. Right breast exhibits no inverted nipple, no mass, no nipple discharge, no skin change and no tenderness. Left breast exhibits no inverted nipple, no mass, no nipple discharge, no skin change and no tenderness. Breasts are symmetrical.     Abdominal: Soft. Bowel sounds are normal. She exhibits mass (small ill-defined mass 1 inch inferior and to the R of epigastrum).  Neurological: She is alert and oriented to person, place, and time.  Skin: Skin is warm, dry and intact. No rash noted. No erythema. No pallor.  Psychiatric: She has a normal mood and affect. Her speech is normal and behavior is normal. Judgment and thought content normal. Cognition and memory are normal.  Nursing note and vitals reviewed.    Shoulder: right    Inspection: abnormal  Swelling: no   Ecchymosis: no   Erythema: no   Deformity: R scapula winging      Tenderness to Palpation:    Acromion: no    AC joint:no    Clavicle: no    Bicipital groove: no    Scapular spine: no    Coracoid process: no    Humeral head: no    Supraspinatus tendon: no     Range of Motion:     Abduction:Decreased    Adduction: Normal    Flexion: Decreased    Extension: Decreased    Internal rotation: Normal    External rotation: Normal    Painful arc: no     Muscle Strength:     Flexion: Decreased    Extension: Decreased    Abduction: Decreased    Adduction: Normal    External rotation: Normal    Internal rotation: Normal     Neuro: Sensation WNL. and Upper extremity reflexes WNL.     Special Tests:     Neer sign: Negative    Hawkins sign: Negative    Cross arm adduction: Negative    Yergason sign: Negative    O'brien sign: Negative     Speed sign: Negative       Assessment & Plan:   Problem List Items Addressed This Visit    None    Visit Diagnoses    Injury of long thoracic nerve, initial encounter    -  Primary    Exercises given today. Referral to orthopedics. Await consult.     Relevant Orders    Ambulatory referral to Orthopedic Surgery    Abdominal mass        Will obtain US. Ordered today. Await results.     Relevant Orders    US Abdomen Complete    Breast pain        Seems to be due to pec issues. Stretches for 1 month, if not  better, will refer for breast implant eval.        Follow up plan: Return in about 4 weeks (around 10/15/2015) for recheck breast pain.

## 2015-09-18 ENCOUNTER — Ambulatory Visit
Admission: RE | Admit: 2015-09-18 | Discharge: 2015-09-18 | Disposition: A | Discharge status: Home / Self Care | Payer: Managed Care, Other (non HMO) | Source: Ambulatory Visit | Attending: Family Medicine | Admitting: Family Medicine

## 2015-09-18 ENCOUNTER — Telehealth: Payer: Self-pay | Admitting: Family Medicine

## 2015-09-18 ENCOUNTER — Other Ambulatory Visit: Payer: Self-pay | Admitting: Family Medicine

## 2015-09-18 DIAGNOSIS — R1900 Intra-abdominal and pelvic swelling, mass and lump, unspecified site: Secondary | ICD-10-CM | POA: Diagnosis present

## 2015-09-18 DIAGNOSIS — K824 Cholesterolosis of gallbladder: Secondary | ICD-10-CM | POA: Insufficient documentation

## 2015-09-18 NOTE — Telephone Encounter (Signed)
Called to give her her results. Nothing deep, superficial lesion in the skin. Will see general surgery for removal if it starts bothering her.

## 2015-10-23 ENCOUNTER — Ambulatory Visit: Payer: Managed Care, Other (non HMO) | Admitting: Family Medicine

## 2016-02-18 ENCOUNTER — Ambulatory Visit (INDEPENDENT_AMBULATORY_CARE_PROVIDER_SITE_OTHER): Payer: Managed Care, Other (non HMO) | Admitting: Family Medicine

## 2016-02-18 ENCOUNTER — Encounter: Payer: Self-pay | Admitting: Family Medicine

## 2016-02-18 VITALS — BP 144/84 | HR 71 | Temp 98.7°F | Ht 62.6 in | Wt 151.2 lb

## 2016-02-18 DIAGNOSIS — F331 Major depressive disorder, recurrent, moderate: Secondary | ICD-10-CM

## 2016-02-18 DIAGNOSIS — Z23 Encounter for immunization: Secondary | ICD-10-CM | POA: Diagnosis not present

## 2016-02-18 DIAGNOSIS — F3340 Major depressive disorder, recurrent, in remission, unspecified: Secondary | ICD-10-CM | POA: Insufficient documentation

## 2016-02-18 MED ORDER — CITALOPRAM HYDROBROMIDE 20 MG PO TABS: 20.0000 mg | ORAL_TABLET | Freq: Every day | ORAL | 3 refills | Status: DC

## 2016-02-18 MED ORDER — HYDROXYZINE HCL 10 MG PO TABS: 5.0000 mg | ORAL_TABLET | Freq: Three times a day (TID) | ORAL | 1 refills | Status: DC | PRN

## 2016-02-18 NOTE — Patient Instructions (Signed)

## 2016-02-18 NOTE — Progress Notes (Signed)
BP (!) 144/84 (BP Location: Left Arm, Patient Position: Sitting, Cuff Size: Normal)   Pulse 71   Temp 98.7 F (37.1 C)   Ht 5' 2.6" (1.59 m)   Wt 151 lb 3.2 oz (68.6 kg)   LMP  (LMP Unknown)   SpO2 99%   BMI 27.13 kg/m    Subjective:    Patient ID: Sandra Wright, female    DOB: 1975/05/23, 40 y.o.   MRN: [id number]  HPI: Sandra Wright is a 40 y.o. female  Chief Complaint  Patient presents with  . Depression  . Anxiety   DEPRESSION/ANXIETY- a couple of months, seemed to come out of the blue, Feb 2015 her nephew attempted suicide Mood status: exacerbated Satisfied with current treatment?: no Symptom severity: severe  Duration of current treatment : Not on anything Psychotherapy/counseling: no  Previous psychiatric medications: zoloft Depressed mood: yes  Anxious mood: yes  Excessive worrying: yes Irritability: yes  Sweating: yes Nausea: yes Palpitations:yes Hyperventilation: no Panic attacks: yes Agoraphobia: no  Obscessions/compulsions: yes Anhedonia: no Significant weight loss or gain: yes Insomnia: yes hard to fall asleep Fatigue: yes Feelings of worthlessness or guilt: yes Impaired concentration/indecisiveness: yes Suicidal ideations: no Hopelessness: no Crying spells: yes  Recent Stressors/Life Changes: no   Relationship problems: no   Family stress: no     Financial stress: no    Job stress: no    Recent death/loss: no Depression screen PHQ 2/9 02/18/2016  Decreased Interest 2  Down, Depressed, Hopeless 2  PHQ - 2 Score 4  Altered sleeping 3  Tired, decreased energy 2  Change in appetite 3  Feeling bad or failure about yourself  3  Trouble concentrating 2  Moving slowly or fidgety/restless 2  Suicidal thoughts 0  PHQ-9 Score 19   GAD 7 : Generalized Anxiety Score 02/18/2016  Nervous, Anxious, on Edge 3  Control/stop worrying 3  Worry too much - different things 3  Trouble relaxing 3  Restless 2  Easily annoyed or irritable 2  Afraid -  awful might happen 1  Total GAD 7 Score 17  Anxiety Difficulty Very difficult    Relevant past medical, surgical, family and social history reviewed and updated as indicated. Interim medical history since our last visit reviewed. Allergies and medications reviewed and updated.  Review of Systems  Constitutional: Negative.   Respiratory: Negative.   Cardiovascular: Negative.   Psychiatric/Behavioral: Positive for decreased concentration, dysphoric mood and sleep disturbance. Negative for agitation, behavioral problems, confusion, hallucinations, self-injury and suicidal ideas. The patient is nervous/anxious. The patient is not hyperactive.     Per HPI unless specifically indicated above     Objective:    BP (!) 144/84 (BP Location: Left Arm, Patient Position: Sitting, Cuff Size: Normal)   Pulse 71   Temp 98.7 F (37.1 C)   Ht 5' 2.6" (1.59 m)   Wt 151 lb 3.2 oz (68.6 kg)   LMP  (LMP Unknown)   SpO2 99%   BMI 27.13 kg/m   Wt Readings from Last 3 Encounters:  02/18/16 151 lb 3.2 oz (68.6 kg)  09/17/15 146 lb (66.2 kg)  03/06/15 141 lb (64 kg)    Physical Exam  Constitutional: She is oriented to person, place, and time. She appears well-developed and well-nourished. No distress.  HENT:  Head: Normocephalic and atraumatic.  Right Ear: Hearing normal.  Left Ear: Hearing normal.  Nose: Nose normal.  Eyes: Conjunctivae and lids are normal. Right eye exhibits no discharge. Left  eye exhibits no discharge. No scleral icterus.  Cardiovascular: Normal rate, regular rhythm, normal heart sounds and intact distal pulses.  Exam reveals no gallop and no friction rub.   No murmur heard. Pulmonary/Chest: Effort normal and breath sounds normal. No respiratory distress. She has no wheezes. She has no rales. She exhibits no tenderness.  Musculoskeletal: Normal range of motion.  Neurological: She is alert and oriented to person, place, and time.  Skin: Skin is warm, dry and intact. No rash  noted. No erythema. No pallor.  Psychiatric: Her speech is normal and behavior is normal. Judgment and thought content normal. Her mood appears anxious. Cognition and memory are normal. She exhibits a depressed mood.  Nursing note and vitals reviewed.   Results for orders placed or performed in visit on 03/06/15  CBC with Differential/Platelet  Result Value Ref Range   WBC 12.0 (H) 3.4 - 10.8 x10E3/uL   RBC 4.31 3.77 - 5.28 x10E6/uL   Hemoglobin 13.3 11.1 - 15.9 g/dL   Hematocrit 16.140.5 09.634.0 - 46.6 %   MCV 94 79 - 97 fL   MCH 30.9 26.6 - 33.0 pg   MCHC 32.8 31.5 - 35.7 g/dL   RDW 04.512.6 40.912.3 - 81.115.4 %   Platelets 436 (H) 150 - 379 x10E3/uL   Neutrophils 49 %   Lymphs 38 %   Monocytes 4 %   Eos 8 %   Basos 1 %   Neutrophils Absolute 5.8 1.4 - 7.0 x10E3/uL   Lymphocytes Absolute 4.6 (H) 0.7 - 3.1 x10E3/uL   Monocytes Absolute 0.5 0.1 - 0.9 x10E3/uL   EOS (ABSOLUTE) 1.0 (H) 0.0 - 0.4 x10E3/uL   Basophils Absolute 0.1 0.0 - 0.2 x10E3/uL   Immature Granulocytes 0 %   Immature Grans (Abs) 0.0 0.0 - 0.1 x10E3/uL  Allergens, Zone 2  Result Value Ref Range   Class Description Comment    D Pteronyssinus IgE 0.20 (A) Class 0/I kU/L   D Farinae IgE 0.15 (A) Class 0/I kU/L   Cat Dander IgE <0.10 Class 0 kU/L   Dog Dander IgE <0.10 Class 0 kU/L   [place] Grass IgE <0.10 Class 0 kU/L   Timothy Grass IgE <0.10 Class 0 kU/L   Johnson Grass IgE <0.10 Class 0 kU/L   Bahia Grass IgE <0.10 Class 0 kU/L   Cockroach, American IgE 0.14 (A) Class 0/I kU/L   Penicillium Chrysogen IgE <0.10 Class 0 kU/L   Cladosporium Herbarum IgE <0.10 Class 0 kU/L   Aspergillus Fumigatus IgE <0.10 Class 0 kU/L   Mucor Racemosus IgE <0.10 Class 0 kU/L   Alternaria Alternata IgE <0.10 Class 0 kU/L   Stemphylium Herbarum IgE <0.10 Class 0 kU/L   Common Silver Charletta CousinBirch IgE <0.10 Class 0 kU/L   Oak, White IgE <0.10 Class 0 kU/L   Elm, American IgE <0.10 Class 0 kU/L   Maple/Box Elder IgE <0.10 Class 0 kU/L   Hickory,  White IgE <0.10 Class 0 kU/L   Amer Sycamore IgE Qn <0.10 Class 0 kU/L   White Mulberry IgE <0.10 Class 0 kU/L   Sweet gum IgE RAST Ql <0.10 Class 0 kU/L   Cedar, [place] IgE <0.10 Class 0 kU/L   Ragweed, Short IgE <0.10 Class 0 kU/L   Mugwort IgE Qn <0.10 Class 0 kU/L   Plantain, English IgE <0.10 Class 0 kU/L   Pigweed, Rough IgE <0.10 Class 0 kU/L   Sheep Sorrel IgE Qn <0.10 Class 0 kU/L   Nettle IgE <0.10 Class 0  kU/L      Assessment & Plan:   Problem List Items Addressed This Visit      Other   Moderate episode of recurrent major depressive disorder (HCC) - Primary    Will start her on celexa and hydroxyzine. List of counselors given. Follow up 4 weeks. Call with any concerns.        Relevant Medications   citalopram (CELEXA) 20 MG tablet   hydrOXYzine (ATARAX/VISTARIL) 10 MG tablet    Other Visit Diagnoses    Immunization due       Flu shot given today.   Relevant Orders   Flu Vaccine QUAD 36+ mos PF IM (Fluarix & Fluzone Quad PF) (Completed)       Follow up plan: Return in about 4 weeks (around 03/17/2016) for Follow up mood.

## 2016-02-18 NOTE — Assessment & Plan Note (Signed)
Will start her on celexa and hydroxyzine. List of counselors given. Follow up 4 weeks. Call with any concerns.

## 2016-03-17 ENCOUNTER — Encounter: Payer: Self-pay | Admitting: Family Medicine

## 2016-03-17 ENCOUNTER — Ambulatory Visit (INDEPENDENT_AMBULATORY_CARE_PROVIDER_SITE_OTHER): Payer: Managed Care, Other (non HMO) | Admitting: Family Medicine

## 2016-03-17 DIAGNOSIS — F331 Major depressive disorder, recurrent, moderate: Secondary | ICD-10-CM | POA: Diagnosis not present

## 2016-03-17 MED ORDER — CITALOPRAM HYDROBROMIDE 20 MG PO TABS: 20.0000 mg | ORAL_TABLET | Freq: Every day | ORAL | 1 refills | Status: DC

## 2016-03-17 NOTE — Assessment & Plan Note (Signed)
Under good control. Continue current regimen. Continue to monitor. Recheck 6 months. Call with any concerns.  

## 2016-03-17 NOTE — Progress Notes (Signed)
BP 133/81 (BP Location: Left Arm, Patient Position: Sitting, Cuff Size: Normal)   Pulse (!) 57   Temp 98.3 F (36.8 C)   Wt 149 lb 6.4 oz (67.8 kg)   LMP  (LMP Unknown)   SpO2 99%   BMI 26.80 kg/m    Subjective:    Patient ID: Sandra Wright, female    DOB: 14-May-1976, 40 y.o.   MRN: [id number]  HPI: Sandra Wright is a 40 y.o. female  Chief Complaint  Patient presents with  . Depression   DEPRESSION Mood status: better Satisfied with current treatment?: yes Symptom severity: mild  Duration of current treatment : 1 month Side effects: no Medication compliance: excellent compliance Psychotherapy/counseling: no  Previous psychiatric medications: celexa and hydroxyzine (didn't like hydroxyzine- made her sleepy) Depressed mood: no Anxious mood: yes Anhedonia: no Significant weight loss or gain: no Insomnia: no  Fatigue: yes Feelings of worthlessness or guilt: no Impaired concentration/indecisiveness: no Suicidal ideations: no Hopelessness: no Crying spells: no Depression screen [facility] 2/9 03/17/2016 02/18/2016  Decreased Interest 0 2  Down, Depressed, Hopeless 0 2  PHQ - 2 Score 0 4  Altered sleeping - 3  Tired, decreased energy - 2  Change in appetite - 3  Feeling bad or failure about yourself  - 3  Trouble concentrating - 2  Moving slowly or fidgety/restless - 2  Suicidal thoughts - 0  PHQ-9 Score - 19   GAD 7 : Generalized Anxiety Score 03/17/2016 02/18/2016  Nervous, Anxious, on Edge 1 3  Control/stop worrying 0 3  Worry too much - different things 0 3  Trouble relaxing 0 3  Restless 0 2  Easily annoyed or irritable 0 2  Afraid - awful might happen 0 1  Total GAD 7 Score 1 17  Anxiety Difficulty Somewhat difficult Very difficult   Relevant past medical, surgical, family and social history reviewed and updated as indicated. Interim medical history since our last visit reviewed. Allergies and medications reviewed and updated.  Review of Systems    Constitutional: Negative.   Respiratory: Negative.   Psychiatric/Behavioral: Negative.     Per HPI unless specifically indicated above     Objective:    BP 133/81 (BP Location: Left Arm, Patient Position: Sitting, Cuff Size: Normal)   Pulse (!) 57   Temp 98.3 F (36.8 C)   Wt 149 lb 6.4 oz (67.8 kg)   LMP  (LMP Unknown)   SpO2 99%   BMI 26.80 kg/m   Wt Readings from Last 3 Encounters:  03/17/16 149 lb 6.4 oz (67.8 kg)  02/18/16 151 lb 3.2 oz (68.6 kg)  09/17/15 146 lb (66.2 kg)    Physical Exam  Constitutional: She is oriented to person, place, and time. She appears well-developed and well-nourished. No distress.  HENT:  Head: Normocephalic and atraumatic.  Right Ear: Hearing normal.  Left Ear: Hearing normal.  Nose: Nose normal.  Eyes: Conjunctivae and lids are normal. Right eye exhibits no discharge. Left eye exhibits no discharge. No scleral icterus.  Pulmonary/Chest: Effort normal. No respiratory distress.  Musculoskeletal: Normal range of motion.  Neurological: She is alert and oriented to person, place, and time.  Skin: Skin is warm, dry and intact. No rash noted. She is not diaphoretic. No erythema. No pallor.  Psychiatric: She has a normal mood and affect. Her speech is normal and behavior is normal. Judgment and thought content normal. Cognition and memory are normal.  Nursing note and vitals reviewed.   Results  for orders placed or performed in visit on 03/06/15  CBC with Differential/Platelet  Result Value Ref Range   WBC 12.0 (H) 3.4 - 10.8 x10E3/uL   RBC 4.31 3.77 - 5.28 x10E6/uL   Hemoglobin 13.3 11.1 - 15.9 g/dL   Hematocrit 16.140.5 09.634.0 - 46.6 %   MCV 94 79 - 97 fL   MCH 30.9 26.6 - 33.0 pg   MCHC 32.8 31.5 - 35.7 g/dL   RDW 04.512.6 40.912.3 - 81.115.4 %   Platelets 436 (H) 150 - 379 x10E3/uL   Neutrophils 49 %   Lymphs 38 %   Monocytes 4 %   Eos 8 %   Basos 1 %   Neutrophils Absolute 5.8 1.4 - 7.0 x10E3/uL   Lymphocytes Absolute 4.6 (H) 0.7 - 3.1  x10E3/uL   Monocytes Absolute 0.5 0.1 - 0.9 x10E3/uL   EOS (ABSOLUTE) 1.0 (H) 0.0 - 0.4 x10E3/uL   Basophils Absolute 0.1 0.0 - 0.2 x10E3/uL   Immature Granulocytes 0 %   Immature Grans (Abs) 0.0 0.0 - 0.1 x10E3/uL  Allergens, Zone 2  Result Value Ref Range   Class Description Comment    D Pteronyssinus IgE 0.20 (A) Class 0/I kU/L   D Farinae IgE 0.15 (A) Class 0/I kU/L   Cat Dander IgE <0.10 Class 0 kU/L   Dog Dander IgE <0.10 Class 0 kU/L   [place] Grass IgE <0.10 Class 0 kU/L   Timothy Grass IgE <0.10 Class 0 kU/L   Johnson Grass IgE <0.10 Class 0 kU/L   Bahia Grass IgE <0.10 Class 0 kU/L   Cockroach, American IgE 0.14 (A) Class 0/I kU/L   Penicillium Chrysogen IgE <0.10 Class 0 kU/L   Cladosporium Herbarum IgE <0.10 Class 0 kU/L   Aspergillus Fumigatus IgE <0.10 Class 0 kU/L   Mucor Racemosus IgE <0.10 Class 0 kU/L   Alternaria Alternata IgE <0.10 Class 0 kU/L   Stemphylium Herbarum IgE <0.10 Class 0 kU/L   Common Silver Charletta CousinBirch IgE <0.10 Class 0 kU/L   Oak, White IgE <0.10 Class 0 kU/L   Elm, American IgE <0.10 Class 0 kU/L   Maple/Box Elder IgE <0.10 Class 0 kU/L   Hickory, White IgE <0.10 Class 0 kU/L   Amer Sycamore IgE Qn <0.10 Class 0 kU/L   White Mulberry IgE <0.10 Class 0 kU/L   Sweet gum IgE RAST Ql <0.10 Class 0 kU/L   Cedar, [place] IgE <0.10 Class 0 kU/L   Ragweed, Short IgE <0.10 Class 0 kU/L   Mugwort IgE Qn <0.10 Class 0 kU/L   Plantain, English IgE <0.10 Class 0 kU/L   Pigweed, Rough IgE <0.10 Class 0 kU/L   Sheep Sorrel IgE Qn <0.10 Class 0 kU/L   Nettle IgE <0.10 Class 0 kU/L      Assessment & Plan:   Problem List Items Addressed This Visit      Other   Moderate episode of recurrent major depressive disorder (HCC)    Under good control. Continue current regimen. Continue to monitor. Recheck 6 months. Call with any concerns.       Relevant Medications   citalopram (CELEXA) 20 MG tablet    Other Visit Diagnoses   None.      Follow up  plan: Return in about 6 months (around 09/15/2016) for Physical/Follow up mood.

## 2016-03-17 NOTE — Patient Instructions (Signed)
Head space Calm Aura

## 2016-05-15 ENCOUNTER — Other Ambulatory Visit: Payer: Self-pay | Admitting: Family Medicine

## 2016-06-27 ENCOUNTER — Other Ambulatory Visit: Payer: Self-pay | Admitting: Obstetrics and Gynecology

## 2016-06-27 DIAGNOSIS — Z1231 Encounter for screening mammogram for malignant neoplasm of breast: Secondary | ICD-10-CM

## 2016-08-01 ENCOUNTER — Ambulatory Visit
Admission: RE | Admit: 2016-08-01 | Discharge: 2016-08-01 | Disposition: A | Discharge status: Home / Self Care | Payer: Managed Care, Other (non HMO) | Source: Ambulatory Visit | Attending: Obstetrics and Gynecology | Admitting: Obstetrics and Gynecology

## 2016-08-01 ENCOUNTER — Other Ambulatory Visit: Payer: Self-pay | Admitting: Obstetrics and Gynecology

## 2016-08-01 DIAGNOSIS — Z9882 Breast implant status: Secondary | ICD-10-CM | POA: Diagnosis not present

## 2016-08-01 DIAGNOSIS — Z1231 Encounter for screening mammogram for malignant neoplasm of breast: Secondary | ICD-10-CM

## 2016-09-18 ENCOUNTER — Encounter: Payer: Self-pay | Admitting: Family Medicine

## 2016-09-18 ENCOUNTER — Ambulatory Visit (INDEPENDENT_AMBULATORY_CARE_PROVIDER_SITE_OTHER): Payer: Managed Care, Other (non HMO) | Admitting: Family Medicine

## 2016-09-18 VITALS — BP 128/82 | HR 87 | Temp 98.2°F | Ht 62.5 in | Wt 141.3 lb

## 2016-09-18 DIAGNOSIS — F1021 Alcohol dependence, in remission: Secondary | ICD-10-CM | POA: Diagnosis not present

## 2016-09-18 DIAGNOSIS — J301 Allergic rhinitis due to pollen: Secondary | ICD-10-CM

## 2016-09-18 DIAGNOSIS — Z0000 Encounter for general adult medical examination without abnormal findings: Secondary | ICD-10-CM | POA: Diagnosis not present

## 2016-09-18 DIAGNOSIS — F4010 Social phobia, unspecified: Secondary | ICD-10-CM | POA: Diagnosis not present

## 2016-09-18 DIAGNOSIS — F3340 Major depressive disorder, recurrent, in remission, unspecified: Secondary | ICD-10-CM

## 2016-09-18 LAB — UA/M W/RFLX CULTURE, ROUTINE
Bilirubin, UA: NEGATIVE
Glucose, UA: NEGATIVE
KETONES UA: NEGATIVE
LEUKOCYTES UA: NEGATIVE
NITRITE UA: NEGATIVE
PROTEIN UA: NEGATIVE
SPEC GRAV UA: 1.03 (ref 1.005–1.030)
UUROB: 1 mg/dL (ref 0.2–1.0)
pH, UA: 5.5 (ref 5.0–7.5)

## 2016-09-18 LAB — MICROSCOPIC EXAMINATION: WBC, UA: NONE SEEN /hpf (ref 0–?)

## 2016-09-18 MED ORDER — CITALOPRAM HYDROBROMIDE 20 MG PO TABS: 20.0000 mg | ORAL_TABLET | Freq: Every day | ORAL | 1 refills | Status: DC

## 2016-09-18 MED ORDER — BUSPIRONE HCL 5 MG PO TABS: 5.0000 mg | ORAL_TABLET | Freq: Three times a day (TID) | ORAL | 2 refills | Status: DC

## 2016-09-18 MED ORDER — NALTREXONE HCL 50 MG PO TABS: ORAL_TABLET | ORAL | 1 refills | Status: DC

## 2016-09-18 MED ORDER — MONTELUKAST SODIUM 10 MG PO TABS: 10.0000 mg | ORAL_TABLET | Freq: Every day | ORAL | 1 refills | Status: DC

## 2016-09-18 NOTE — Assessment & Plan Note (Signed)
Will start buspar to be taken PRN before anxiety provoking events. Recheck 2 months.

## 2016-09-18 NOTE — Assessment & Plan Note (Signed)
Will start naltrexone to help with cravings. Rx sent to her pharmacy. Recheck 2 months. Continue to follow with AA.

## 2016-09-18 NOTE — Patient Instructions (Addendum)
Health Maintenance, Female Adopting a healthy lifestyle and getting preventive care can go a long way to promote health and wellness. Talk with your health care provider about what schedule of regular examinations is right for you. This is a good chance for you to check in with your provider about disease prevention and staying healthy. In between checkups, there are plenty of things you can do on your own. Experts have done a lot of research about which lifestyle changes and preventive measures are most likely to keep you healthy. Ask your health care provider for more information. Weight and diet Eat a healthy diet  Be sure to include plenty of vegetables, fruits, low-fat dairy products, and lean protein.  Do not eat a lot of foods high in solid fats, added sugars, or salt.  Get regular exercise. This is one of the most important things you can do for your health.  Most adults should exercise for at least 150 minutes each week. The exercise should increase your heart rate and make you sweat (moderate-intensity exercise).  Most adults should also do strengthening exercises at least twice a week. This is in addition to the moderate-intensity exercise. Maintain a healthy weight  Body mass index (BMI) is a measurement that can be used to identify possible weight problems. It estimates body fat based on height and weight. Your health care provider can help determine your BMI and help you achieve or maintain a healthy weight.  For females 76 years of age and older:  A BMI below 18.5 is considered underweight.  A BMI of 18.5 to 24.9 is normal.  A BMI of 25 to 29.9 is considered overweight.  A BMI of 30 and above is considered obese. Watch levels of cholesterol and blood lipids  You should start having your blood tested for lipids and cholesterol at 40 years of age, then have this test every 5 years.  You may need to have your cholesterol levels checked more often if:  Your lipid or  cholesterol levels are high.  You are older than 40 years of age.  You are at high risk for heart disease. Cancer screening Lung Cancer  Lung cancer screening is recommended for adults 64-42 years old who are at high risk for lung cancer because of a history of smoking.  A yearly low-dose CT scan of the lungs is recommended for people who:  Currently smoke.  Have quit within the past 15 years.  Have at least a 30-pack-year history of smoking. A pack year is smoking an average of one pack of cigarettes a day for 1 year.  Yearly screening should continue until it has been 15 years since you quit.  Yearly screening should stop if you develop a health problem that would prevent you from having lung cancer treatment. Breast Cancer  Practice breast self-awareness. This means understanding how your breasts normally appear and feel.  It also means doing regular breast self-exams. Let your health care provider know about any changes, no matter how small.  If you are in your 20s or 30s, you should have a clinical breast exam (CBE) by a health care provider every 1-3 years as part of a regular health exam.  If you are 34 or older, have a CBE every year. Also consider having a breast X-ray (mammogram) every year.  If you have a family history of breast cancer, talk to your health care provider about genetic screening.  If you are at high risk for breast cancer, talk  to your health care provider about having an MRI and a mammogram every year.  Breast cancer gene (BRCA) assessment is recommended for women who have family members with BRCA-related cancers. BRCA-related cancers include:  Breast.  Ovarian.  Tubal.  Peritoneal cancers.  Results of the assessment will determine the need for genetic counseling and BRCA1 and BRCA2 testing. Cervical Cancer  Your health care provider may recommend that you be screened regularly for cancer of the pelvic organs (ovaries, uterus, and vagina).  This screening involves a pelvic examination, including checking for microscopic changes to the surface of your cervix (Pap test). You may be encouraged to have this screening done every 3 years, beginning at age 24.  For women ages 66-65, health care providers may recommend pelvic exams and Pap testing every 3 years, or they may recommend the Pap and pelvic exam, combined with testing for human papilloma virus (HPV), every 5 years. Some types of HPV increase your risk of cervical cancer. Testing for HPV may also be done on women of any age with unclear Pap test results.  Other health care providers may not recommend any screening for nonpregnant women who are considered low risk for pelvic cancer and who do not have symptoms. Ask your health care provider if a screening pelvic exam is right for you.  If you have had past treatment for cervical cancer or a condition that could lead to cancer, you need Pap tests and screening for cancer for at least 20 years after your treatment. If Pap tests have been discontinued, your risk factors (such as having a new sexual partner) need to be reassessed to determine if screening should resume. Some women have medical problems that increase the chance of getting cervical cancer. In these cases, your health care provider may recommend more frequent screening and Pap tests. Colorectal Cancer  This type of cancer can be detected and often prevented.  Routine colorectal cancer screening usually begins at 40 years of age and continues through 40 years of age.  Your health care provider may recommend screening at an earlier age if you have risk factors for colon cancer.  Your health care provider may also recommend using home test kits to check for hidden blood in the stool.  A small camera at the end of a tube can be used to examine your colon directly (sigmoidoscopy or colonoscopy). This is done to check for the earliest forms of colorectal cancer.  Routine  screening usually begins at age 41.  Direct examination of the colon should be repeated every 5-10 years through 40 years of age. However, you may need to be screened more often if early forms of precancerous polyps or small growths are found. Skin Cancer  Check your skin from head to toe regularly.  Tell your health care provider about any new moles or changes in moles, especially if there is a change in a mole's shape or color.  Also tell your health care provider if you have a mole that is larger than the size of a pencil eraser.  Always use sunscreen. Apply sunscreen liberally and repeatedly throughout the day.  Protect yourself by wearing long sleeves, pants, a wide-brimmed hat, and sunglasses whenever you are outside. Heart disease, diabetes, and high blood pressure  High blood pressure causes heart disease and increases the risk of stroke. High blood pressure is more likely to develop in:  People who have blood pressure in the high end of the normal range (130-139/85-89 mm Hg).  People who are overweight or obese.  People who are African American.  If you are 59-24 years of age, have your blood pressure checked every 3-5 years. If you are 34 years of age or older, have your blood pressure checked every year. You should have your blood pressure measured twice-once when you are at a hospital or clinic, and once when you are not at a hospital or clinic. Record the average of the two measurements. To check your blood pressure when you are not at a hospital or clinic, you can use:  An automated blood pressure machine at a pharmacy.  A home blood pressure monitor.  If you are between 29 years and 60 years old, ask your health care provider if you should take aspirin to prevent strokes.  Have regular diabetes screenings. This involves taking a blood sample to check your fasting blood sugar level.  If you are at a normal weight and have a low risk for diabetes, have this test once  every three years after 40 years of age.  If you are overweight and have a high risk for diabetes, consider being tested at a younger age or more often. Preventing infection Hepatitis B  If you have a higher risk for hepatitis B, you should be screened for this virus. You are considered at high risk for hepatitis B if:  You were born in a country where hepatitis B is common. Ask your health care provider which countries are considered high risk.  Your parents were born in a high-risk country, and you have not been immunized against hepatitis B (hepatitis B vaccine).  You have HIV or AIDS.  You use needles to inject street drugs.  You live with someone who has hepatitis B.  You have had sex with someone who has hepatitis B.  You get hemodialysis treatment.  You take certain medicines for conditions, including cancer, organ transplantation, and autoimmune conditions. Hepatitis C  Blood testing is recommended for:  Everyone born from [birth year] through 1964.  Anyone with known risk factors for hepatitis C. Sexually transmitted infections (STIs)  You should be screened for sexually transmitted infections (STIs) including gonorrhea and chlamydia if:  You are sexually active and are younger than 40 years of age.  You are older than 40 years of age and your health care provider tells you that you are at risk for this type of infection.  Your sexual activity has changed since you were last screened and you are at an increased risk for chlamydia or gonorrhea. Ask your health care provider if you are at risk.  If you do not have HIV, but are at risk, it may be recommended that you take a prescription medicine daily to prevent HIV infection. This is called pre-exposure prophylaxis (PrEP). You are considered at risk if:  You are sexually active and do not regularly use condoms or know the HIV status of your partner(s).  You take drugs by injection.  You are sexually active with a partner  who has HIV. Talk with your health care provider about whether you are at high risk of being infected with HIV. If you choose to begin PrEP, you should first be tested for HIV. You should then be tested every 3 months for as long as you are taking PrEP. Pregnancy  If you are premenopausal and you may become pregnant, ask your health care provider about preconception counseling.  If you may become pregnant, take 400 to 800 micrograms (mcg) of folic acid  every day.  If you want to prevent pregnancy, talk to your health care provider about birth control (contraception). Osteoporosis and menopause  Osteoporosis is a disease in which the bones lose minerals and strength with aging. This can result in serious bone fractures. Your risk for osteoporosis can be identified using a bone density scan.  If you are 25 years of age or older, or if you are at risk for osteoporosis and fractures, ask your health care provider if you should be screened.  Ask your health care provider whether you should take a calcium or vitamin D supplement to lower your risk for osteoporosis.  Menopause may have certain physical symptoms and risks.  Hormone replacement therapy may reduce some of these symptoms and risks. Talk to your health care provider about whether hormone replacement therapy is right for you. Follow these instructions at home:  Schedule regular health, dental, and eye exams.  Stay current with your immunizations.  Do not use any tobacco products including cigarettes, chewing tobacco, or electronic cigarettes.  If you are pregnant, do not drink alcohol.  If you are breastfeeding, limit how much and how often you drink alcohol.  Limit alcohol intake to no more than 1 drink per day for nonpregnant women. One drink equals 12 ounces of beer, 5 ounces of wine, or 1 ounces of hard liquor.  Do not use street drugs.  Do not share needles.  Ask your health care provider for help if you need support  or information about quitting drugs.  Tell your health care provider if you often feel depressed.  Tell your health care provider if you have ever been abused or do not feel safe at home. This information is not intended to replace advice given to you by your health care provider. Make sure you discuss any questions you have with your health care provider. Document Released: 11/18/2010 Document Revised: 10/11/2015 Document Reviewed: 02/06/2015 Elsevier Interactive Patient Education  2017 [employer].  Achilles Tendon Rupture, Phase I Rehab Ask your health care provider which exercises are safe for you. Do exercises exactly as told by your health care provider and adjust them as directed. It is normal to feel mild stretching, pulling, tightness, or discomfort as you do these exercises, but you should stop right away if you feel sudden pain or your pain gets worse.Do not begin these exercises until told by your health care provider. Stretching and range of motion exercises These exercises warm up your muscles and joints and improve the movement and flexibility of your lower leg and heel. These exercises also help to relieve pain, numbness, and tingling. Exercise A: Dorsiflexion and plantar flexion, active range of motion   1. Sit with your left / right knee straight or bent. Do not rest your foot on anything. 2. Flex your left / right ankle to tilt the top of your foot toward your shin. Stop at the first point of resistance or at the angle where your health care provider told you to stop. 3. Hold this position for __________ seconds. 4. Point your toes downward to tilt the top of your foot away from your shin. 5. Hold this position for __________ seconds. Repeat __________ times. Complete this exercise __________ times a day. Exercise B: Ankle alphabet   1. Sit with your left / right leg supported at the lower leg.  Do not rest your foot on anything.  Make sure your foot has room to move  freely. 2. Think of your left /  right foot as a paintbrush, and move your foot to trace each letter of the alphabet in the air. Keep your hip and knee still while you trace. 3. Trace every letter from A to Z. Repeat __________ times. Complete this exercise __________ times a day. Strengthening exercises These exercises build strength and endurance in your lower leg. Endurance is the ability to use your muscles for a long time, even after they get tired. Exercise C: Dorsiflexion   1. Secure a rubber exercise band or tube to an object, such as a table leg, that will stay still when the band is pulled. Secure the other end around your left / right foot. 2. Sit on the floor facing the object with your left / right leg extended and your toes pointing down. The band or tube should be slightly tense when your foot is relaxed. 3. Slowly flex your left / right ankle and toes to bring your foot toward you. Stop when you feel resistance in your Achilles tendon or stop at the angle where your health care provider told you to stop. 4. Hold this position for __________ seconds. 5. Slowly return your foot to the starting position. Repeat __________ times. Complete this exercise __________ times a day. Exercise D: Eversion  1. Sit on the floor with your legs straight out in front of you. 2. Loop a rubber exercise band around your left / right foot, across the top part of the walking surface (ball) of that foot. Hold the band in your hands or secure it to a stable object. 3. Slowly push your foot outward, away from your other leg. 4. Hold this position for __________ seconds. 5. Slowly return to the starting position. Repeat __________ times. Complete this exercise __________ times a day. Exercise E: Inversion  1. Sit on the floor with your legs straight out in front of you. 2. Loop a rubber exercise band around your left / right foot, across the top part of the walking surface (ball) of that foot. Hold the  band in your hands or secure it to a stable object. 3. Slowly push your foot inward, toward your other leg. 4. Hold this position for __________ seconds. 5. Slowly return your foot to the starting position. Repeat __________ times. Complete this exercise __________ times a day. Exercise F: Plantar flexion, seated   1. Sit on a chair with your feet flat on the floor. 2. If told by your health care provider, place __________ lb. on your left / right knee. 3. Keeping your toes firmly on the floor, lift your left / right heel as far as you can without increasing any discomfort in your ankle. 4. Bring your heel back down to the floor. Repeat __________ times. Complete this exercise __________ times a day. This information is not intended to replace advice given to you by your health care provider. Make sure you discuss any questions you have with your health care provider. Document Released: 05/05/2005 Document Revised: 01/10/2016 Document Reviewed: 03/28/2015 Elsevier Interactive Patient Education  2017 [employer].

## 2016-09-18 NOTE — Progress Notes (Addendum)
BP 128/82 (BP Location: Left Arm, Patient Position: Sitting, Cuff Size: Normal)   Pulse 87   Temp 98.2 F (36.8 C)   Ht 5' 2.5" (1.588 m)   Wt 141 lb 4.8 oz (64.1 kg)   SpO2 98%   BMI 25.43 kg/m    Subjective:    Patient ID: Sandra Wright, female    DOB: 10/01/1975, 41 y.o.   MRN: [id number]  HPI: Sandra Wright is a 41 y.o. female presenting on 09/18/2016 for comprehensive medical examination. Current medical complaints include:  Has been feeling really anxious when going into social situations. Would like something to take during that time. Also going to AA- 3 days in recovery. Working really hard. Would like something to help with the cravings.   DEPRESSION Mood status: stable Satisfied with current treatment?: no Symptom severity: mild  Duration of current treatment : chronic Side effects: no Medication compliance: excellent compliance Psychotherapy/counseling: no  Previous psychiatric medications: celexa Depressed mood: no Anxious mood: yes Anhedonia: no Significant weight loss or gain: no Insomnia: no  Fatigue: yes Feelings of worthlessness or guilt: no Impaired concentration/indecisiveness: no Suicidal ideations: no Hopelessness: no Crying spells: no Depression screen [facility] 2/9 09/18/2016 03/17/2016 02/18/2016  Decreased Interest 1 0 2  Down, Depressed, Hopeless 0 0 2  PHQ - 2 Score 1 0 4  Altered sleeping - - 3  Tired, decreased energy - - 2  Change in appetite - - 3  Feeling bad or failure about yourself  - - 3  Trouble concentrating - - 2  Moving slowly or fidgety/restless - - 2  Suicidal thoughts - - 0  PHQ-9 Score - - 19   GAD 7 : Generalized Anxiety Score 09/18/2016 03/17/2016 02/18/2016  Nervous, Anxious, on Edge 2 1 3   Control/stop worrying 0 0 3  Worry too much - different things 0 0 3  Trouble relaxing 1 0 3  Restless 1 0 2  Easily annoyed or irritable 1 0 2  Afraid - awful might happen 0 0 1  Total GAD 7 Score 5 1 17   Anxiety Difficulty -  Somewhat difficult Very difficult    ALLERGIES Duration: chronic Runny nose: no  Nasal congestion: no Nasal itching: no Sneezing: no Eye swelling, itching or discharge: no Post nasal drip: no Cough: no Sinus pressure: no  Ear pain: no  Ear pressure: no  Fever: no  Known environmental allergy: yes History of asthma: no  She currently lives with: husband Menopausal Symptoms: no  Past Medical History:  Past Medical History:  Diagnosis Date  . Degenerative cervical disc   . Endometriosis     Surgical History:  Past Surgical History:  Procedure Laterality Date  . AUGMENTATION MAMMAPLASTY  2001  . BREAST ENHANCEMENT SURGERY    . RIGHT OOPHORECTOMY Right     Medications:  Current Outpatient Prescriptions on File Prior to Visit  Medication Sig  . levonorgestrel-ethinyl estradiol (SEASONALE,INTROVALE,JOLESSA) 0.15-0.03 MG tablet Take 1 tablet by mouth daily.   No current facility-administered medications on file prior to visit.     Allergies:  Allergies  Allergen Reactions  . Percocet [Oxycodone-Acetaminophen] Nausea And Vomiting    Social History:  Social History   Social History  . Marital status: Married    Spouse name: N/A  . Number of children: N/A  . Years of education: N/A   Occupational History  . Not on file.   Social History Main Topics  . Smoking status: Former Smoker  Packs/day: 1.00    Years: 15.00    Types: Cigarettes    Quit date: 05/19/2002  . Smokeless tobacco: Never Used  . Alcohol use Yes     Comment: occasionally  . Drug use: No  . Sexual activity: Yes    Birth control/ protection: Pill   Other Topics Concern  . Not on file   Social History Narrative  . No narrative on file   History  Smoking Status  . Former Smoker  . Packs/day: 1.00  . Years: 15.00  . Types: Cigarettes  . Quit date: 05/19/2002  Smokeless Tobacco  . Never Used   History  Alcohol Use  . Yes    Comment: occasionally    Family History:  Family  History  Problem Relation Age of Onset  . Arthritis Mother   . Diabetes Mother   . Hypertension Mother   . Cancer Neg Hx   . COPD Neg Hx   . Heart disease Neg Hx   . Stroke Neg Hx   . Breast cancer Neg Hx     Past medical history, surgical history, medications, allergies, family history and social history reviewed with patient today and changes made to appropriate areas of the chart.   Review of Systems  Constitutional: Negative.   HENT: Negative.   Eyes: Negative.   Respiratory: Negative.   Cardiovascular: Negative.   Gastrointestinal: Positive for constipation. Negative for abdominal pain, blood in stool, diarrhea, heartburn, melena, nausea and vomiting.  Genitourinary: Negative.   Musculoskeletal: Positive for joint pain. Negative for back pain, falls, myalgias and neck pain.  Skin: Negative.   Neurological: Negative.   Endo/Heme/Allergies: Negative for environmental allergies and polydipsia. Does not bruise/bleed easily.  Psychiatric/Behavioral: Negative for depression, hallucinations, memory loss, substance abuse and suicidal ideas. The patient is nervous/anxious. The patient does not have insomnia.     All other ROS negative except what is listed above and in the HPI.      Objective:    BP 128/82 (BP Location: Left Arm, Patient Position: Sitting, Cuff Size: Normal)   Pulse 87   Temp 98.2 F (36.8 C)   Ht 5' 2.5" (1.588 m)   Wt 141 lb 4.8 oz (64.1 kg)   SpO2 98%   BMI 25.43 kg/m   Wt Readings from Last 3 Encounters:  09/18/16 141 lb 4.8 oz (64.1 kg)  03/17/16 149 lb 6.4 oz (67.8 kg)  02/18/16 151 lb 3.2 oz (68.6 kg)    Physical Exam  Constitutional: She is oriented to person, place, and time. She appears well-developed and well-nourished. No distress.  HENT:  Head: Normocephalic and atraumatic.  Right Ear: Hearing and external ear normal.  Left Ear: Hearing and external ear normal.  Nose: Nose normal.  Mouth/Throat: Oropharynx is clear and moist. No  oropharyngeal exudate.  Eyes: Conjunctivae, EOM and lids are normal. Pupils are equal, round, and reactive to light. Right eye exhibits no discharge. Left eye exhibits no discharge. No scleral icterus.  Neck: Normal range of motion. Neck supple. No JVD present. No tracheal deviation present. No thyromegaly present.  Cardiovascular: Normal rate, regular rhythm, normal heart sounds and intact distal pulses.  Exam reveals no gallop and no friction rub.   No murmur heard. Pulmonary/Chest: Effort normal and breath sounds normal. No stridor. No respiratory distress. She has no wheezes. She has no rales. She exhibits no tenderness.  Abdominal: Soft. Bowel sounds are normal. She exhibits no distension and no mass. There is no tenderness. There is  no rebound and no guarding.  Genitourinary:  Genitourinary Comments: Breast and pelvic exams deferred- done at GYN  Musculoskeletal: Normal range of motion. She exhibits no edema, tenderness or deformity.  Lymphadenopathy:    She has no cervical adenopathy.  Neurological: She is alert and oriented to person, place, and time. She has normal reflexes. She displays normal reflexes. No cranial nerve deficit. She exhibits normal muscle tone. Coordination normal.  Skin: Skin is warm, dry and intact. No rash noted. She is not diaphoretic. No erythema. No pallor.  Psychiatric: She has a normal mood and affect. Her speech is normal and behavior is normal. Judgment and thought content normal. Cognition and memory are normal.  Nursing note and vitals reviewed.   Results for orders placed or performed in visit on 03/06/15  CBC with Differential/Platelet  Result Value Ref Range   WBC 12.0 (H) 3.4 - 10.8 x10E3/uL   RBC 4.31 3.77 - 5.28 x10E6/uL   Hemoglobin 13.3 11.1 - 15.9 g/dL   Hematocrit 16.1 09.6 - 46.6 %   MCV 94 79 - 97 fL   MCH 30.9 26.6 - 33.0 pg   MCHC 32.8 31.5 - 35.7 g/dL   RDW 04.5 40.9 - 81.1 %   Platelets 436 (H) 150 - 379 x10E3/uL   Neutrophils 49 %     Lymphs 38 %   Monocytes 4 %   Eos 8 %   Basos 1 %   Neutrophils Absolute 5.8 1.4 - 7.0 x10E3/uL   Lymphocytes Absolute 4.6 (H) 0.7 - 3.1 x10E3/uL   Monocytes Absolute 0.5 0.1 - 0.9 x10E3/uL   EOS (ABSOLUTE) 1.0 (H) 0.0 - 0.4 x10E3/uL   Basophils Absolute 0.1 0.0 - 0.2 x10E3/uL   Immature Granulocytes 0 %   Immature Grans (Abs) 0.0 0.0 - 0.1 x10E3/uL  Allergens, Zone 2  Result Value Ref Range   Class Description Comment    D Pteronyssinus IgE 0.20 (A) Class 0/I kU/L   D Farinae IgE 0.15 (A) Class 0/I kU/L   Cat Dander IgE <0.10 Class 0 kU/L   Dog Dander IgE <0.10 Class 0 kU/L   [place] Grass IgE <0.10 Class 0 kU/L   Timothy Grass IgE <0.10 Class 0 kU/L   Elishah Ashmore Grass IgE <0.10 Class 0 kU/L   B[place]ia Grass IgE <0.10 Class 0 kU/L   Cockroach, American IgE 0.14 (A) Class 0/I kU/L   Penicillium Chrysogen IgE <0.10 Class 0 kU/L   Cladosporium Herbarum IgE <0.10 Class 0 kU/L   Aspergillus Fumigatus IgE <0.10 Class 0 kU/L   Mucor Racemosus IgE <0.10 Class 0 kU/L   Alternaria Alternata IgE <0.10 Class 0 kU/L   Stemphylium Herbarum IgE <0.10 Class 0 kU/L   Common Silver Charletta Cousin IgE <0.10 Class 0 kU/L   Oak, White IgE <0.10 Class 0 kU/L   Elm, American IgE <0.10 Class 0 kU/L   Maple/Box Elder IgE <0.10 Class 0 kU/L   Hickory, White IgE <0.10 Class 0 kU/L   Amer Sycamore IgE Qn <0.10 Class 0 kU/L   White Mulberry IgE <0.10 Class 0 kU/L   Sweet gum IgE RAST Ql <0.10 Class 0 kU/L   Cedar, [place] IgE <0.10 Class 0 kU/L   Ragweed, Short IgE <0.10 Class 0 kU/L   Mugwort IgE Qn <0.10 Class 0 kU/L   Plantain, English IgE <0.10 Class 0 kU/L   Pigweed, Rough IgE <0.10 Class 0 kU/L   Sheep Sorrel IgE Qn <0.10 Class 0 kU/L   Nettle IgE <0.10 Class 0  kU/L      Assessment & Plan:   Problem List Items Addressed This Visit      Respiratory   Allergic rhinitis    Under good control on current regimen. Continue to monitor. Call with any concerns. Refill given.        Other   Depression,  major, recurrent, in remission (HCC)    Under good control on current regimen. Continue to monitor. Call with any concerns. Refill given.      Relevant Medications   busPIRone (BUSPAR) 5 MG tablet   citalopram (CELEXA) 20 MG tablet   Recovering alcoholic (HCC)    Will start naltrexone to help with cravings. Rx sent to her pharmacy. Recheck 2 months. Continue to follow with AA.      Social anxiety disorder    Will start buspar to be taken PRN before anxiety provoking events. Recheck 2 months.        Other Visit Diagnoses    Routine general medical examination at a health care facility    -  Primary   Vaccines up to date. Screening labs checked today. Pap done at GYN. Continue diet and exercise. Continue to monitor.    Relevant Orders   CBC with Differential/Platelet   Comprehensive metabolic panel   Lipid Panel w/o Chol/HDL Ratio   TSH   UA/M w/rflx Culture, Routine       Follow up plan: Return in about 2 months (around 11/18/2016) for Follow up mood and ETOH.   LABORATORY TESTING:  - Pap smear: done elsewhere  IMMUNIZATIONS:   - Tdap: Tetanus vaccination status reviewed: last tetanus booster within 10 years. - Influenza: Up to date - Pneumovax: Refused  PATIENT COUNSELING:   Advised to take 1 mg of folate supplement per day if capable of pregnancy.   Sexuality: Discussed sexually transmitted diseases, partner selection, use of condoms, avoidance of unintended pregnancy  and contraceptive alternatives.   Advised to avoid cigarette smoking.  I discussed with the patient that most people either abstain from alcohol or drink within safe limits (<=14/week and <=4 drinks/occasion for males, <=7/weeks and <= 3 drinks/occasion for females) and that the risk for alcohol disorders and other health effects rises proportionally with the number of drinks per week and how often a drinker exceeds daily limits.  Discussed cessation/primary prevention of drug use and availability of  treatment for abuse.   Diet: Encouraged to adjust caloric intake to maintain  or achieve ideal body weight, to reduce intake of dietary saturated fat and total fat, to limit sodium intake by avoiding high sodium foods and not adding table salt, and to maintain adequate dietary potassium and calcium preferably from fresh fruits, vegetables, and low-fat dairy products.    stressed the importance of regular exercise  Injury prevention: Discussed safety belts, safety helmets, smoke detector, smoking near bedding or upholstery.   Dental health: Discussed importance of regular tooth brushing, flossing, and dental visits.    NEXT PREVENTATIVE PHYSICAL DUE IN 1 YEAR. Return in about 2 months (around 11/18/2016) for Follow up mood and ETOH.

## 2016-09-18 NOTE — Assessment & Plan Note (Signed)
Under good control on current regimen. Continue to monitor. Call with any concerns. Refill given.

## 2016-09-18 NOTE — Assessment & Plan Note (Signed)
Under good control on current regimen. Continue to monitor. Call with any concerns. Refill given. 

## 2016-09-19 ENCOUNTER — Encounter: Payer: Self-pay | Admitting: Family Medicine

## 2016-09-19 LAB — COMPREHENSIVE METABOLIC PANEL
ALBUMIN: 4 g/dL (ref 3.5–5.5)
ALK PHOS: 55 IU/L (ref 39–117)
ALT: 33 IU/L — ABNORMAL HIGH (ref 0–32)
AST: 33 IU/L (ref 0–40)
Albumin/Globulin Ratio: 1.7 (ref 1.2–2.2)
BUN / CREAT RATIO: 12 (ref 9–23)
BUN: 10 mg/dL (ref 6–20)
Bilirubin Total: 0.2 mg/dL (ref 0.0–1.2)
CO2: 23 mmol/L (ref 18–29)
CREATININE: 0.82 mg/dL (ref 0.57–1.00)
Calcium: 9.3 mg/dL (ref 8.7–10.2)
Chloride: 102 mmol/L (ref 96–106)
GFR calc Af Amer: 104 mL/min/{1.73_m2} (ref >59)
GFR calc non Af Amer: 90 mL/min/{1.73_m2} (ref >59)
GLOBULIN, TOTAL: 2.4 g/dL (ref 1.5–4.5)
Glucose: 81 mg/dL (ref 65–99)
Potassium: 4.4 mmol/L (ref 3.5–5.2)
SODIUM: 140 mmol/L (ref 134–144)
TOTAL PROTEIN: 6.4 g/dL (ref 6.0–8.5)

## 2016-09-19 LAB — CBC WITH DIFFERENTIAL/PLATELET
BASOS: 1 %
Basophils Absolute: 0.1 10*3/uL (ref 0.0–0.2)
EOS (ABSOLUTE): 0.4 10*3/uL (ref 0.0–0.4)
EOS: 5 %
HEMATOCRIT: 38.5 % (ref 34.0–46.6)
HEMOGLOBIN: 12.1 g/dL (ref 11.1–15.9)
IMMATURE GRANS (ABS): 0 10*3/uL (ref 0.0–0.1)
Immature Granulocytes: 0 %
Lymphocytes Absolute: 3.4 10*3/uL — ABNORMAL HIGH (ref 0.7–3.1)
Lymphs: 35 %
MCH: 31.3 pg (ref 26.6–33.0)
MCHC: 31.4 g/dL — ABNORMAL LOW (ref 31.5–35.7)
MCV: 100 fL — ABNORMAL HIGH (ref 79–97)
MONOCYTES: 6 %
Monocytes Absolute: 0.6 10*3/uL (ref 0.1–0.9)
NEUTROS ABS: 5.1 10*3/uL (ref 1.4–7.0)
Neutrophils: 53 %
Platelets: 355 10*3/uL (ref 150–379)
RBC: 3.86 x10E6/uL (ref 3.77–5.28)
RDW: 13.1 % (ref 12.3–15.4)
WBC: 9.7 10*3/uL (ref 3.4–10.8)

## 2016-09-19 LAB — LIPID PANEL W/O CHOL/HDL RATIO
CHOLESTEROL TOTAL: 170 mg/dL (ref 100–199)
HDL: 60 mg/dL (ref >39)
LDL CALC: 83 mg/dL (ref 0–99)
Triglycerides: 137 mg/dL (ref 0–149)
VLDL CHOLESTEROL CAL: 27 mg/dL (ref 5–40)

## 2016-09-19 LAB — TSH: TSH: 0.772 u[IU]/mL (ref 0.450–4.500)

## 2016-11-27 ENCOUNTER — Encounter: Payer: Self-pay | Admitting: Family Medicine

## 2016-11-27 ENCOUNTER — Ambulatory Visit (INDEPENDENT_AMBULATORY_CARE_PROVIDER_SITE_OTHER): Payer: Managed Care, Other (non HMO) | Admitting: Family Medicine

## 2016-11-27 DIAGNOSIS — F1021 Alcohol dependence, in remission: Secondary | ICD-10-CM

## 2016-11-27 DIAGNOSIS — F3340 Major depressive disorder, recurrent, in remission, unspecified: Secondary | ICD-10-CM

## 2016-11-27 MED ORDER — BUSPIRONE HCL 5 MG PO TABS: 5.0000 mg | ORAL_TABLET | Freq: Three times a day (TID) | ORAL | 1 refills | Status: DC

## 2016-11-27 NOTE — Assessment & Plan Note (Signed)
Doing very well. Under good control. Continue current regimen. Continue to monitor. Call with any concerns.

## 2016-11-27 NOTE — Assessment & Plan Note (Signed)
Doing well on naltrexone. Continue to monitor. Call with any concerns.

## 2016-11-27 NOTE — Progress Notes (Signed)
BP 120/74 (BP Location: Left Arm, Patient Position: Sitting, Cuff Size: Normal)   Pulse 67   Temp 98.9 F (37.2 C)   Wt 133 lb 7 oz (60.5 kg)   SpO2 98%   BMI 24.02 kg/m    Subjective:    Patient ID: Sandra Wright, female    DOB: 30-Sep-1975, 41 y.o.   MRN: [id number]  HPI: Sandra Wright is a 41 y.o. female  Chief Complaint  Patient presents with  . Alcohol Problem  . Depression   ANXIETY/STRESS- doing great! Buspar works great! No side effects. Hasn't had a drink in 72 days! No side effects from the naltrexone. Feeling well, much better than she had been!! Duration:better Anxious mood: no  Excessive worrying: no Irritability: no  Sweating: no Nausea: no Palpitations:no Hyperventilation: no Panic attacks: no Agoraphobia: no  Obscessions/compulsions: no Depressed mood: no [facility] 11/27/2016 09/18/2016 03/17/2016 02/18/2016  Decreased Interest 0 1 0 2  Down, Depressed, Hopeless 0 0 0 2  PHQ - 2 Score 0 1 0 4  Altered sleeping 0 - - 3  Tired, decreased energy 0 - - 2  Change in appetite 0 - - 3  Feeling bad or failure about yourself  0 - - 3  Trouble concentrating 0 - - 2  Moving slowly or fidgety/restless 0 - - 2  Suicidal thoughts 0 - - 0  PHQ-9 Score 0 - - 19   GAD 7 : Generalized Anxiety Score 11/27/2016 09/18/2016 03/17/2016 02/18/2016  Nervous, Anxious, on Edge 0 2 1 3   Control/stop worrying 0 0 0 3  Worry too much - different things 0 0 0 3  Trouble relaxing 0 1 0 3  Restless 0 1 0 2  Easily annoyed or irritable 0 1 0 2  Afraid - awful might happen 0 0 0 1  Total GAD 7 Score 0 5 1 17   Anxiety Difficulty - - Somewhat difficult Very difficult   Anhedonia: no Weight changes: no Insomnia: no   Hypersomnia: no Fatigue/loss of energy: no Feelings of worthlessness: no Feelings of guilt: no Impaired concentration/indecisiveness: no Suicidal ideations: no  Crying spells: no Recent Stressors/Life Changes: no   Relationship problems: no  Family stress: no     Financial stress: no    Job stress: no    Recent death/loss: no  Relevant past medical, surgical, family and social history reviewed and updated as indicated. Interim medical history since our last visit reviewed. Allergies and medications reviewed and updated.  Review of Systems  Constitutional: Negative.   Respiratory: Negative.   Cardiovascular: Negative.   Psychiatric/Behavioral: Negative.     Per HPI unless specifically indicated above     Objective:    BP 120/74 (BP Location: Left Arm, Patient Position: Sitting, Cuff Size: Normal)   Pulse 67   Temp 98.9 F (37.2 C)   Wt 133 lb 7 oz (60.5 kg)   SpO2 98%   BMI 24.02 kg/m   Wt Readings from Last 3 Encounters:  11/27/16 133 lb 7 oz (60.5 kg)  09/18/16 141 lb 4.8 oz (64.1 kg)  03/17/16 149 lb 6.4 oz (67.8 kg)    Physical Exam  Constitutional: She is oriented to person, place, and time. She appears well-developed and well-nourished. No distress.  HENT:  Head: Normocephalic and atraumatic.  Right Ear: Hearing normal.  Left Ear: Hearing normal.  Nose: Nose normal.  Eyes: Conjunctivae and lids are normal. Right eye exhibits no discharge. Left eye exhibits  no discharge. No scleral icterus.  Cardiovascular: Normal rate, regular rhythm, normal heart sounds and intact distal pulses.  Exam reveals no gallop and no friction rub.   No murmur heard. Pulmonary/Chest: Effort normal and breath sounds normal. No respiratory distress. She has no wheezes. She has no rales. She exhibits no tenderness.  Musculoskeletal: Normal range of motion.  Neurological: She is alert and oriented to person, place, and time.  Skin: Skin is warm, dry and intact. No rash noted. She is not diaphoretic. No erythema. No pallor.  Psychiatric: She has a normal mood and affect. Her speech is normal and behavior is normal. Judgment and thought content normal. Cognition and memory are normal.  Nursing note and vitals reviewed.   Results  for orders placed or performed in visit on 09/18/16  Microscopic Examination  Result Value Ref Range   WBC, UA None seen 0 - 5 /hpf   RBC, UA 3-10 (A) 0 - 2 /hpf   Epithelial Cells (non renal) 0-10 0 - 10 /hpf   Renal Epithel, UA 0-10 (A) None seen /hpf   Bacteria, UA Few (A) None seen/Few  CBC with Differential/Platelet  Result Value Ref Range   WBC 9.7 3.4 - 10.8 x10E3/uL   RBC 3.86 3.77 - 5.28 x10E6/uL   Hemoglobin 12.1 11.1 - 15.9 g/dL   Hematocrit 60.4 54.0 - 46.6 %   MCV 100 (H) 79 - 97 fL   MCH 31.3 26.6 - 33.0 pg   MCHC 31.4 (L) 31.5 - 35.7 g/dL   RDW 98.1 19.1 - 47.8 %   Platelets 355 150 - 379 x10E3/uL   Neutrophils 53 Not Estab. %   Lymphs 35 Not Estab. %   Monocytes 6 Not Estab. %   Eos 5 Not Estab. %   Basos 1 Not Estab. %   Neutrophils Absolute 5.1 1.4 - 7.0 x10E3/uL   Lymphocytes Absolute 3.4 (H) 0.7 - 3.1 x10E3/uL   Monocytes Absolute 0.6 0.1 - 0.9 x10E3/uL   EOS (ABSOLUTE) 0.4 0.0 - 0.4 x10E3/uL   Basophils Absolute 0.1 0.0 - 0.2 x10E3/uL   Immature Granulocytes 0 Not Estab. %   Immature Grans (Abs) 0.0 0.0 - 0.1 x10E3/uL  Comprehensive metabolic panel  Result Value Ref Range   Glucose 81 65 - 99 mg/dL   BUN 10 6 - 20 mg/dL   Creatinine, Ser 2.95 0.57 - 1.00 mg/dL   GFR calc non Af Amer 90 >59 mL/min/1.73   GFR calc Af Amer 104 >59 mL/min/1.73   BUN/Creatinine Ratio 12 9 - 23   Sodium 140 134 - 144 mmol/L   Potassium 4.4 3.5 - 5.2 mmol/L   Chloride 102 96 - 106 mmol/L   CO2 23 18 - 29 mmol/L   Calcium 9.3 8.7 - 10.2 mg/dL   Total Protein 6.4 6.0 - 8.5 g/dL   Albumin 4.0 3.5 - 5.5 g/dL   Globulin, Total 2.4 1.5 - 4.5 g/dL   Albumin/Globulin Ratio 1.7 1.2 - 2.2   Bilirubin Total 0.2 0.0 - 1.2 mg/dL   Alkaline Phosphatase 55 39 - 117 IU/L   AST 33 0 - 40 IU/L   ALT 33 (H) 0 - 32 IU/L  Lipid Panel w/o Chol/HDL Ratio  Result Value Ref Range   Cholesterol, Total 170 100 - 199 mg/dL   Triglycerides 621 0 - 149 mg/dL   HDL 60 >30 mg/dL   VLDL  Cholesterol Cal 27 5 - 40 mg/dL   LDL Calculated 83 0 - 99 mg/dL  TSH  Result Value Ref Range   TSH 0.772 0.450 - 4.500 uIU/mL  UA/M w/rflx Culture, Routine  Result Value Ref Range   Specific Gravity, UA 1.030 1.005 - 1.030   pH, UA 5.5 5.0 - 7.5   Color, UA Yellow Yellow   Appearance Ur Clear Clear   Leukocytes, UA Negative Negative   Protein, UA Negative Negative/Trace   Glucose, UA Negative Negative   Ketones, UA Negative Negative   RBC, UA 3+ (A) Negative   Bilirubin, UA Negative Negative   Urobilinogen, Ur 1.0 0.2 - 1.0 mg/dL   Nitrite, UA Negative Negative   Microscopic Examination See below:       Assessment & Plan:   Problem List Items Addressed This Visit      Other   Depression, major, recurrent, in remission (HCC)    Doing very well. Under good control. Continue current regimen. Continue to monitor. Call with any concerns.       Relevant Medications   busPIRone (BUSPAR) 5 MG tablet   Recovering alcoholic (HCC)    Doing well on naltrexone. Continue to monitor. Call with any concerns.           Follow up plan: Return in about 6 months (around 05/30/2017) for Follow up.

## 2017-03-16 ENCOUNTER — Ambulatory Visit (INDEPENDENT_AMBULATORY_CARE_PROVIDER_SITE_OTHER): Payer: Managed Care, Other (non HMO) | Admitting: Family Medicine

## 2017-03-16 ENCOUNTER — Encounter: Payer: Self-pay | Admitting: Family Medicine

## 2017-03-16 VITALS — BP 119/72 | HR 62 | Temp 98.6°F | Wt 127.6 lb

## 2017-03-16 DIAGNOSIS — M2550 Pain in unspecified joint: Secondary | ICD-10-CM

## 2017-03-16 DIAGNOSIS — R5382 Chronic fatigue, unspecified: Secondary | ICD-10-CM | POA: Diagnosis not present

## 2017-03-16 DIAGNOSIS — Z23 Encounter for immunization: Secondary | ICD-10-CM

## 2017-03-16 NOTE — Patient Instructions (Signed)

## 2017-03-16 NOTE — Progress Notes (Signed)
BP 119/72 (BP Location: Left Arm, Patient Position: Sitting, Cuff Size: Normal)   Pulse 62   Temp 98.6 F (37 C)   Wt 127 lb 9 oz (57.9 kg)   SpO2 97%   BMI 22.96 kg/m    Subjective:    Patient ID: Sandra Wright, female    DOB: 12-18-1975, 41 y.o.   MRN: [id number]  HPI: Sandra Wright is a 41 y.o. female  Chief Complaint  Patient presents with  . Fatigue   FATIGUE Duration:  chronic Severity: moderate  Onset: gradual Context when symptoms started:  unknown Symptoms improve with rest: no  Depressive symptoms: no Stress/anxiety: no Insomnia: no  Snoring: yes Observed apnea by bed partner: no Daytime hypersomnolence:no Wakes feeling refreshed: no History of sleep study: no Dysnea on exertion:  yes Orthopnea/PND: yes Chest pain: no Chronic cough: no Lower extremity edema: yes Arthralgias:yes Myalgias: no Weakness: no Rash: no Depression screen [facility] 2/9 03/16/2017 11/27/2016 09/18/2016 03/17/2016 02/18/2016  Decreased Interest 0 0 1 0 2  Down, Depressed, Hopeless 0 0 0 0 2  PHQ - 2 Score 0 0 1 0 4  Altered sleeping 0 0 - - 3  Tired, decreased energy 1 0 - - 2  Change in appetite 0 0 - - 3  Feeling bad or failure about yourself  0 0 - - 3  Trouble concentrating 0 0 - - 2  Moving slowly or fidgety/restless 1 0 - - 2  Suicidal thoughts 0 0 - - 0  PHQ-9 Score 2 0 - - 19  Difficult doing work/chores Somewhat difficult - - - -   GAD 7 : Generalized Anxiety Score 03/16/2017 11/27/2016 09/18/2016 03/17/2016  Nervous, Anxious, on Edge 1 0 2 1  Control/stop worrying 0 0 0 0  Worry too much - different things 0 0 0 0  Trouble relaxing 0 0 1 0  Restless 0 0 1 0  Easily annoyed or irritable 0 0 1 0  Afraid - awful might happen 0 0 0 0  Total GAD 7 Score 1 0 5 1  Anxiety Difficulty Somewhat difficult - - Somewhat difficult    Relevant past medical, surgical, family and social history reviewed and updated as indicated. Interim medical history since our last visit  reviewed. Allergies and medications reviewed and updated.  Review of Systems  Constitutional: Positive for fatigue. Negative for activity change, appetite change, chills, diaphoresis, fever and unexpected weight change.  HENT: Negative.   Respiratory: Positive for shortness of breath. Negative for apnea, cough, choking, chest tightness, wheezing and stridor.   Cardiovascular: Negative.   Psychiatric/Behavioral: Negative.     Per HPI unless specifically indicated above     Objective:    BP 119/72 (BP Location: Left Arm, Patient Position: Sitting, Cuff Size: Normal)   Pulse 62   Temp 98.6 F (37 C)   Wt 127 lb 9 oz (57.9 kg)   SpO2 97%   BMI 22.96 kg/m   Wt Readings from Last 3 Encounters:  03/16/17 127 lb 9 oz (57.9 kg)  11/27/16 133 lb 7 oz (60.5 kg)  09/18/16 141 lb 4.8 oz (64.1 kg)    Physical Exam  Constitutional: She is oriented to person, place, and time. She appears well-developed and well-nourished. No distress.  HENT:  Head: Normocephalic and atraumatic.  Right Ear: Hearing normal.  Left Ear: Hearing normal.  Nose: Nose normal.  Eyes: Conjunctivae and lids are normal. Right eye exhibits no discharge. Left eye exhibits no  discharge. No scleral icterus.  Cardiovascular: Normal rate, regular rhythm, normal heart sounds and intact distal pulses.  Exam reveals no gallop and no friction rub.   No murmur heard. Pulmonary/Chest: Effort normal and breath sounds normal. No respiratory distress. She has no wheezes. She has no rales. She exhibits no tenderness.  Musculoskeletal: Normal range of motion.  Neurological: She is alert and oriented to person, place, and time.  Skin: Skin is warm, dry and intact. No rash noted. She is not diaphoretic. No erythema. No pallor.  Psychiatric: She has a normal mood and affect. Her speech is normal and behavior is normal. Judgment and thought content normal. Cognition and memory are normal.  Nursing note and vitals reviewed.   Results  for orders placed or performed in visit on 09/18/16  Microscopic Examination  Result Value Ref Range   WBC, UA None seen 0 - 5 /hpf   RBC, UA 3-10 (A) 0 - 2 /hpf   Epithelial Cells (non renal) 0-10 0 - 10 /hpf   Renal Epithel, UA 0-10 (A) None seen /hpf   Bacteria, UA Few (A) None seen/Few  CBC with Differential/Platelet  Result Value Ref Range   WBC 9.7 3.4 - 10.8 x10E3/uL   RBC 3.86 3.77 - 5.28 x10E6/uL   Hemoglobin 12.1 11.1 - 15.9 g/dL   Hematocrit 16.1 09.6 - 46.6 %   MCV 100 (H) 79 - 97 fL   MCH 31.3 26.6 - 33.0 pg   MCHC 31.4 (L) 31.5 - 35.7 g/dL   RDW 04.5 40.9 - 81.1 %   Platelets 355 150 - 379 x10E3/uL   Neutrophils 53 Not Estab. %   Lymphs 35 Not Estab. %   Monocytes 6 Not Estab. %   Eos 5 Not Estab. %   Basos 1 Not Estab. %   Neutrophils Absolute 5.1 1.4 - 7.0 x10E3/uL   Lymphocytes Absolute 3.4 (H) 0.7 - 3.1 x10E3/uL   Monocytes Absolute 0.6 0.1 - 0.9 x10E3/uL   EOS (ABSOLUTE) 0.4 0.0 - 0.4 x10E3/uL   Basophils Absolute 0.1 0.0 - 0.2 x10E3/uL   Immature Granulocytes 0 Not Estab. %   Immature Grans (Abs) 0.0 0.0 - 0.1 x10E3/uL  Comprehensive metabolic panel  Result Value Ref Range   Glucose 81 65 - 99 mg/dL   BUN 10 6 - 20 mg/dL   Creatinine, Ser 9.14 0.57 - 1.00 mg/dL   GFR calc non Af Amer 90 >59 mL/min/1.73   GFR calc Af Amer 104 >59 mL/min/1.73   BUN/Creatinine Ratio 12 9 - 23   Sodium 140 134 - 144 mmol/L   Potassium 4.4 3.5 - 5.2 mmol/L   Chloride 102 96 - 106 mmol/L   CO2 23 18 - 29 mmol/L   Calcium 9.3 8.7 - 10.2 mg/dL   Total Protein 6.4 6.0 - 8.5 g/dL   Albumin 4.0 3.5 - 5.5 g/dL   Globulin, Total 2.4 1.5 - 4.5 g/dL   Albumin/Globulin Ratio 1.7 1.2 - 2.2   Bilirubin Total 0.2 0.0 - 1.2 mg/dL   Alkaline Phosphatase 55 39 - 117 IU/L   AST 33 0 - 40 IU/L   ALT 33 (H) 0 - 32 IU/L  Lipid Panel w/o Chol/HDL Ratio  Result Value Ref Range   Cholesterol, Total 170 100 - 199 mg/dL   Triglycerides 782 0 - 149 mg/dL   HDL 60 >95 mg/dL   VLDL  Cholesterol Cal 27 5 - 40 mg/dL   LDL Calculated 83 0 - 99 mg/dL  TSH  Result Value Ref Range   TSH 0.772 0.450 - 4.500 uIU/mL  UA/M w/rflx Culture, Routine  Result Value Ref Range   Specific Gravity, UA 1.030 1.005 - 1.030   pH, UA 5.5 5.0 - 7.5   Color, UA Yellow Yellow   Appearance Ur Clear Clear   Leukocytes, UA Negative Negative   Protein, UA Negative Negative/Trace   Glucose, UA Negative Negative   Ketones, UA Negative Negative   RBC, UA 3+ (A) Negative   Bilirubin, UA Negative Negative   Urobilinogen, Ur 1.0 0.2 - 1.0 mg/dL   Nitrite, UA Negative Negative   Microscopic Examination See below:       Assessment & Plan:   Problem List Items Addressed This Visit    None    Visit Diagnoses    Chronic fatigue    -  Primary   Will check labs today. Await results. If normal, consider increasing celexa vs referral to pulm given breathing issues at night. Normal spiro 2016.   Relevant Orders   CBC with Differential/Platelet   Thyroid Panel With TSH   Rocky mtn spotted fvr abs pnl(IgG+IgM)   Lyme Ab/Western Blot Reflex   Ehrlichia Antibody Panel   Babesia microti Antibody Panel   Iron and TIBC   B12 and Folate Panel   Ferritin   VITAMIN D 25 Hydroxy (Vit-D Deficiency, Fractures)   Immunization due       Flu shot given today.    Relevant Orders   Flu Vaccine QUAD 6+ mos PF IM (Fluarix Quad PF) (Completed)   Arthralgia, unspecified joint       Will check for tick diseases. Call with any concerns.    Relevant Orders   Rocky mtn spotted fvr abs pnl(IgG+IgM)   Lyme Ab/Western Blot Reflex   Ehrlichia Antibody Panel   Babesia microti Antibody Panel   VITAMIN D 25 Hydroxy (Vit-D Deficiency, Fractures)       Follow up plan: Return if symptoms worsen or fail to improve.

## 2017-03-18 ENCOUNTER — Encounter: Payer: Self-pay | Admitting: Family Medicine

## 2017-03-18 LAB — CBC WITH DIFFERENTIAL/PLATELET
Basophils Absolute: 0.1 10*3/uL (ref 0.0–0.2)
Basos: 2 %
EOS (ABSOLUTE): 1 10*3/uL — AB (ref 0.0–0.4)
Eos: 11 %
HEMATOCRIT: 37.8 % (ref 34.0–46.6)
Hemoglobin: 12.2 g/dL (ref 11.1–15.9)
Immature Grans (Abs): 0 10*3/uL (ref 0.0–0.1)
Immature Granulocytes: 0 %
Lymphocytes Absolute: 2.9 10*3/uL (ref 0.7–3.1)
Lymphs: 33 %
MCH: 30.1 pg (ref 26.6–33.0)
MCHC: 32.3 g/dL (ref 31.5–35.7)
MCV: 93 fL (ref 79–97)
Monocytes Absolute: 0.5 10*3/uL (ref 0.1–0.9)
Monocytes: 6 %
NEUTROS ABS: 4.2 10*3/uL (ref 1.4–7.0)
Neutrophils: 48 %
PLATELETS: 343 10*3/uL (ref 150–379)
RBC: 4.05 x10E6/uL (ref 3.77–5.28)
RDW: 13 % (ref 12.3–15.4)
WBC: 8.7 10*3/uL (ref 3.4–10.8)

## 2017-03-18 LAB — BABESIA MICROTI ANTIBODY PANEL

## 2017-03-18 LAB — FERRITIN: FERRITIN: 61 ng/mL (ref 15–150)

## 2017-03-18 LAB — EHRLICHIA ANTIBODY PANEL
E. Chaffeensis (HME) IgM Titer: NEGATIVE
E.Chaffeensis (HME) IgG: NEGATIVE
HGE IGG TITER: NEGATIVE
HGE IGM TITER: NEGATIVE

## 2017-03-18 LAB — THYROID PANEL WITH TSH
FREE THYROXINE INDEX: 1.8 (ref 1.2–4.9)
T3 UPTAKE RATIO: 23 % — AB (ref 24–39)
T4, Total: 7.9 ug/dL (ref 4.5–12.0)
TSH: 1.09 u[IU]/mL (ref 0.450–4.500)

## 2017-03-18 LAB — LYME AB/WESTERN BLOT REFLEX: LYME DISEASE AB, QUANT, IGM: <0.8 index (ref 0.00–0.79)

## 2017-03-18 LAB — IRON AND TIBC
Iron Saturation: 23 % (ref 15–55)
Iron: 84 ug/dL (ref 27–159)
TIBC: 358 ug/dL (ref 250–450)
UIBC: 274 ug/dL (ref 131–425)

## 2017-03-18 LAB — ROCKY MTN SPOTTED FVR ABS PNL(IGG+IGM)
RMSF IgG: NEGATIVE
RMSF IgM: 0.25 index (ref 0.00–0.89)

## 2017-03-18 LAB — B12 AND FOLATE PANEL
Folate: 12.6 ng/mL (ref >3.0)
Vitamin B-12: 557 pg/mL (ref 232–1245)

## 2017-03-18 LAB — VITAMIN D 25 HYDROXY (VIT D DEFICIENCY, FRACTURES): VIT D 25 HYDROXY: 39.1 ng/mL (ref 30.0–100.0)

## 2017-06-03 ENCOUNTER — Encounter: Payer: Self-pay | Admitting: Family Medicine

## 2017-06-03 ENCOUNTER — Ambulatory Visit: Payer: Managed Care, Other (non HMO) | Admitting: Family Medicine

## 2017-06-03 VITALS — BP 111/71 | HR 63 | Temp 98.8°F | Wt 134.0 lb

## 2017-06-03 DIAGNOSIS — F3340 Major depressive disorder, recurrent, in remission, unspecified: Secondary | ICD-10-CM

## 2017-06-03 DIAGNOSIS — R5382 Chronic fatigue, unspecified: Secondary | ICD-10-CM

## 2017-06-03 DIAGNOSIS — Z23 Encounter for immunization: Secondary | ICD-10-CM | POA: Diagnosis not present

## 2017-06-03 DIAGNOSIS — F4010 Social phobia, unspecified: Secondary | ICD-10-CM | POA: Diagnosis not present

## 2017-06-03 DIAGNOSIS — F1021 Alcohol dependence, in remission: Secondary | ICD-10-CM | POA: Diagnosis not present

## 2017-06-03 MED ORDER — BUSPIRONE HCL 5 MG PO TABS: 5.0000 mg | ORAL_TABLET | Freq: Three times a day (TID) | ORAL | 1 refills | Status: DC

## 2017-06-03 MED ORDER — CITALOPRAM HYDROBROMIDE 40 MG PO TABS: 40.0000 mg | ORAL_TABLET | Freq: Every day | ORAL | 1 refills | Status: DC

## 2017-06-03 MED ORDER — MONTELUKAST SODIUM 10 MG PO TABS: 10.0000 mg | ORAL_TABLET | Freq: Every day | ORAL | 1 refills | Status: DC

## 2017-06-03 NOTE — Progress Notes (Signed)
BP 111/71 (BP Location: Left Arm, Patient Position: Sitting, Cuff Size: Normal)   Pulse 63   Temp 98.8 F (37.1 C)   Wt 134 lb (60.8 kg)   SpO2 97%   BMI 24.12 kg/m    Subjective:    Patient ID: Sandra Wright, female    DOB: 24-Nov-1975, 42 y.o.   MRN: [id number]  HPI: Sandra Wright is a 42 y.o. female  Chief Complaint  Patient presents with  . Depression  . Anxiety   DEPRESSION Mood status: controlled Satisfied with current treatment?: yes Symptom severity: mild  Duration of current treatment : chronic Side effects: no Medication compliance: excellent compliance Psychotherapy/counseling: no  Previous psychiatric medications: celexa and buspar Depressed mood: no Anxious mood: no Anhedonia: no Significant weight loss or gain: no Insomnia: no  Fatigue: yes Feelings of worthlessness or guilt: no Impaired concentration/indecisiveness: no Suicidal ideations: no Hopelessness: no Crying spells: no [facility] 06/03/2017 03/16/2017 11/27/2016 09/18/2016 03/17/2016  Decreased Interest 0 0 0 1 0  Down, Depressed, Hopeless 0 0 0 0 0  PHQ - 2 Score 0 0 0 1 0  Altered sleeping 0 0 0 - -  Tired, decreased energy 2 1 0 - -  Change in appetite 0 0 0 - -  Feeling bad or failure about yourself  0 0 0 - -  Trouble concentrating 0 0 0 - -  Moving slowly or fidgety/restless 0 1 0 - -  Suicidal thoughts 0 0 0 - -  PHQ-9 Score 2 2 0 - -  Difficult doing work/chores - Somewhat difficult - - -    Relevant past medical, surgical, family and social history reviewed and updated as indicated. Interim medical history since our last visit reviewed. Allergies and medications reviewed and updated.  Review of Systems  Constitutional: Negative.   Respiratory: Negative.   Cardiovascular: Negative.   Psychiatric/Behavioral: Negative.     Per HPI unless specifically indicated above     Objective:    BP 111/71 (BP Location: Left Arm, Patient Position: Sitting, Cuff Size:  Normal)   Pulse 63   Temp 98.8 F (37.1 C)   Wt 134 lb (60.8 kg)   SpO2 97%   BMI 24.12 kg/m   Wt Readings from Last 3 Encounters:  06/03/17 134 lb (60.8 kg)  03/16/17 127 lb 9 oz (57.9 kg)  11/27/16 133 lb 7 oz (60.5 kg)    Physical Exam  Constitutional: She is oriented to person, place, and time. She appears well-developed and well-nourished. No distress.  HENT:  Head: Normocephalic and atraumatic.  Right Ear: Hearing normal.  Left Ear: Hearing normal.  Nose: Nose normal.  Eyes: Conjunctivae and lids are normal. Right eye exhibits no discharge. Left eye exhibits no discharge. No scleral icterus.  Cardiovascular: Normal rate, regular rhythm, normal heart sounds and intact distal pulses. Exam reveals no gallop and no friction rub.  No murmur heard. Pulmonary/Chest: Effort normal and breath sounds normal. No respiratory distress. She has no wheezes. She has no rales. She exhibits no tenderness.  Musculoskeletal: Normal range of motion.  Neurological: She is alert and oriented to person, place, and time.  Skin: Skin is warm, dry and intact. No rash noted. She is not diaphoretic. No erythema. No pallor.  Psychiatric: She has a normal mood and affect. Her speech is normal and behavior is normal. Judgment and thought content normal. Cognition and memory are normal.  Nursing note and vitals reviewed.   Results for  orders placed or performed in visit on 03/16/17  CBC with Differential/Platelet  Result Value Ref Range   WBC 8.7 3.4 - 10.8 x10E3/uL   RBC 4.05 3.77 - 5.28 x10E6/uL   Hemoglobin 12.2 11.1 - 15.9 g/dL   Hematocrit 09.8 11.9 - 46.6 %   MCV 93 79 - 97 fL   MCH 30.1 26.6 - 33.0 pg   MCHC 32.3 31.5 - 35.7 g/dL   RDW 14.7 82.9 - 56.2 %   Platelets 343 150 - 379 x10E3/uL   Neutrophils 48 Not Estab. %   Lymphs 33 Not Estab. %   Monocytes 6 Not Estab. %   Eos 11 Not Estab. %   Basos 2 Not Estab. %   Neutrophils Absolute 4.2 1.4 - 7.0 x10E3/uL   Lymphocytes Absolute 2.9  0.7 - 3.1 x10E3/uL   Monocytes Absolute 0.5 0.1 - 0.9 x10E3/uL   EOS (ABSOLUTE) 1.0 (H) 0.0 - 0.4 x10E3/uL   Basophils Absolute 0.1 0.0 - 0.2 x10E3/uL   Immature Granulocytes 0 Not Estab. %   Immature Grans (Abs) 0.0 0.0 - 0.1 x10E3/uL  Thyroid Panel With TSH  Result Value Ref Range   TSH 1.090 0.450 - 4.500 uIU/mL   T4, Total 7.9 4.5 - 12.0 ug/dL   T3 Uptake Ratio 23 (L) 24 - 39 %   Free Thyroxine Index 1.8 1.2 - 4.9  Rocky mtn spotted fvr abs pnl(IgG+IgM)  Result Value Ref Range   RMSF IgG Negative Negative   RMSF IgM 0.25 0.00 - 0.89 index  Lyme Ab/Western Blot Reflex  Result Value Ref Range   Lyme IgG/IgM Ab <0.91 0.00 - 0.90 ISR   LYME DISEASE AB, QUANT, IGM <0.80 0.00 - 0.79 index  Ehrlichia Antibody Panel  Result Value Ref Range   E.Chaffeensis (HME) IgG Negative Neg:<1:64   E. Chaffeensis (HME) IgM Titer Negative Neg:<1:20   HGE IgG Titer Negative Neg:<1:64   HGE IgM Titer Negative Neg:<1:20  Babesia microti Antibody Panel  Result Value Ref Range   Babesia microti IgM <1:10 Neg:<1:10   Babesia microti IgG <1:10 Neg:<1:10  Iron and TIBC  Result Value Ref Range   Total Iron Binding Capacity 358 250 - 450 ug/dL   UIBC 130 865 - 784 ug/dL   Iron 84 27 - 696 ug/dL   Iron Saturation 23 15 - 55 %  B12 and Folate Panel  Result Value Ref Range   Vitamin B-12 557 232 - 1,245 pg/mL   Folate 12.6 >3.0 ng/mL  Ferritin  Result Value Ref Range   Ferritin 61 15 - 150 ng/mL  VITAMIN D 25 Hydroxy (Vit-D Deficiency, Fractures)  Result Value Ref Range   Vit D, 25-Hydroxy 39.1 30.0 - 100.0 ng/mL      Assessment & Plan:   Problem List Items Addressed This Visit      Other   Depression, major, recurrent, in remission (HCC) - Primary    Doing great! Continue current regimen. Continue to monitor. Call with any concerns.       Relevant Medications   citalopram (CELEXA) 40 MG tablet   busPIRone (BUSPAR) 5 MG tablet   Recovering alcoholic (HCC)    Doing well right now. If she  needs her naltrexone again- will give it to her without an appointment to help with cravings. Call with any concerns.       Social anxiety disorder    Doing great! Continue current regimen. Continue to monitor. Call with any concerns.  Relevant Medications   citalopram (CELEXA) 40 MG tablet   busPIRone (BUSPAR) 5 MG tablet    Other Visit Diagnoses    Chronic fatigue       Will increase her celexa and check out daylight light bulbs. Call with any concerns.        Follow up plan: Return in about 6 months (around 12/01/2017) for Physical.

## 2017-06-03 NOTE — Assessment & Plan Note (Signed)
Doing well right now. If she needs her naltrexone again- will give it to her without an appointment to help with cravings. Call with any concerns.

## 2017-06-03 NOTE — Patient Instructions (Signed)
Tdap Vaccine (Tetanus, Diphtheria and Pertussis): What You Need to Know 1. Why get vaccinated? Tetanus, diphtheria and pertussis are very serious diseases. Tdap vaccine can protect us from these diseases. And, Tdap vaccine given to pregnant women can protect newborn babies against pertussis. TETANUS (Lockjaw) is rare in the [place] today. It causes painful muscle tightening and stiffness, usually all over the body.  It can lead to tightening of muscles in the head and neck so you can't open your mouth, swallow, or sometimes even breathe. Tetanus kills about 1 out of 10 people who are infected even after receiving the best medical care.  DIPHTHERIA is also rare in the [place] today. It can cause a thick coating to form in the back of the throat.  It can lead to breathing problems, heart failure, paralysis, and death.  PERTUSSIS (Whooping Cough) causes severe coughing spells, which can cause difficulty breathing, vomiting and disturbed sleep.  It can also lead to weight loss, incontinence, and rib fractures. Up to 2 in 100 adolescents and 5 in 100 adults with pertussis are hospitalized or have complications, which could include pneumonia or death.  These diseases are caused by bacteria. Diphtheria and pertussis are spread from person to person through secretions from coughing or sneezing. Tetanus enters the body through cuts, scratches, or wounds. Before vaccines, as many as 200,000 cases of diphtheria, 200,000 cases of pertussis, and hundreds of cases of tetanus, were reported in the [place] each year. Since vaccination began, reports of cases for tetanus and diphtheria have dropped by about 99% and for pertussis by about 80%. 2. Tdap vaccine Tdap vaccine can protect adolescents and adults from tetanus, diphtheria, and pertussis. One dose of Tdap is routinely given at age 11 or 12. People who did not get Tdap at that age should get it as soon as possible. Tdap is especially  important for healthcare professionals and anyone having close contact with a baby younger than 12 months. Pregnant women should get a dose of Tdap during every pregnancy, to protect the newborn from pertussis. Infants are most at risk for severe, life-threatening complications from pertussis. Another vaccine, called Td, protects against tetanus and diphtheria, but not pertussis. A Td booster should be given every 10 years. Tdap may be given as one of these boosters if you have never gotten Tdap before. Tdap may also be given after a severe cut or burn to prevent tetanus infection. Your doctor or the person giving you the vaccine can give you more information. Tdap may safely be given at the same time as other vaccines. 3. Some people should not get this vaccine  A person who has ever had a life-threatening allergic reaction after a previous dose of any diphtheria, tetanus or pertussis containing vaccine, OR has a severe allergy to any part of this vaccine, should not get Tdap vaccine. Tell the person giving the vaccine about any severe allergies.  Anyone who had coma or long repeated seizures within 7 days after a childhood dose of DTP or DTaP, or a previous dose of Tdap, should not get Tdap, unless a cause other than the vaccine was found. They can still get Td.  Talk to your doctor if you: ? have seizures or another nervous system problem, ? had severe pain or swelling after any vaccine containing diphtheria, tetanus or pertussis, ? ever had a condition called Guillain-Barr Syndrome (GBS), ? aren't feeling well on the day the shot is scheduled. 4. Risks With any medicine, including   vaccines, there is a chance of side effects. These are usually mild and go away on their own. Serious reactions are also possible but are rare. Most people who get Tdap vaccine do not have any problems with it. Mild problems following Tdap: (Did not interfere with activities)  Pain where the shot was given (about  3 in 4 adolescents or 2 in 3 adults)  Redness or swelling where the shot was given (about 1 person in 5)  Mild fever of at least 100.4F (up to about 1 in 25 adolescents or 1 in 100 adults)  Headache (about 3 or 4 people in 10)  Tiredness (about 1 person in 3 or 4)  Nausea, vomiting, diarrhea, stomach ache (up to 1 in 4 adolescents or 1 in 10 adults)  Chills, sore joints (about 1 person in 10)  Body aches (about 1 person in 3 or 4)  Rash, swollen glands (uncommon)  Moderate problems following Tdap: (Interfered with activities, but did not require medical attention)  Pain where the shot was given (up to 1 in 5 or 6)  Redness or swelling where the shot was given (up to about 1 in 16 adolescents or 1 in 12 adults)  Fever over 102F (about 1 in 100 adolescents or 1 in 250 adults)  Headache (about 1 in 7 adolescents or 1 in 10 adults)  Nausea, vomiting, diarrhea, stomach ache (up to 1 or 3 people in 100)  Swelling of the entire arm where the shot was given (up to about 1 in 500).  Severe problems following Tdap: (Unable to perform usual activities; required medical attention)  Swelling, severe pain, bleeding and redness in the arm where the shot was given (rare).  Problems that could happen after any vaccine:  People sometimes faint after a medical procedure, including vaccination. Sitting or lying down for about 15 minutes can help prevent fainting, and injuries caused by a fall. Tell your doctor if you feel dizzy, or have vision changes or ringing in the ears.  Some people get severe pain in the shoulder and have difficulty moving the arm where a shot was given. This happens very rarely.  Any medication can cause a severe allergic reaction. Such reactions from a vaccine are very rare, estimated at fewer than 1 in a million doses, and would happen within a few minutes to a few hours after the vaccination. As with any medicine, there is a very remote chance of a vaccine  causing a serious injury or death. The safety of vaccines is always being monitored. For more information, visit: www.cdc.gov/vaccinesafety/ 5. What if there is a serious problem? What should I look for? Look for anything that concerns you, such as signs of a severe allergic reaction, very high fever, or unusual behavior. Signs of a severe allergic reaction can include hives, swelling of the face and throat, difficulty breathing, a fast heartbeat, dizziness, and weakness. These would usually start a few minutes to a few hours after the vaccination. What should I do?  If you think it is a severe allergic reaction or other emergency that can't wait, call 9-1-1 or get the person to the nearest hospital. Otherwise, call your doctor.  Afterward, the reaction should be reported to the Vaccine Adverse Event Reporting System (VAERS). Your doctor might file this report, or you can do it yourself through the VAERS web site at www.vaers.hhs.gov, or by calling 1-800-822-7967. ? VAERS does not give medical advice. 6. The National Vaccine Injury Compensation Program The National   Vaccine Injury Compensation Program (VICP) is a federal program that was created to compensate people who may have been injured by certain vaccines. Persons who believe they may have been injured by a vaccine can learn about the program and about filing a claim by calling 1-800-338-2382 or visiting the VICP website at www.hrsa.gov/vaccinecompensation. There is a time limit to file a claim for compensation. 7. How can I learn more?  Ask your doctor. He or she can give you the vaccine package insert or suggest other sources of information.  Call your local or state health department.  Contact the Centers for Disease Control and Prevention (CDC): ? Call 1-800-232-4636 (1-800-CDC-INFO) or ? Visit CDC's website at www.cdc.gov/vaccines CDC Tdap Vaccine VIS (07/12/13) This information is not intended to replace advice given to you by your  health care provider. Make sure you discuss any questions you have with your health care provider. Document Released: 11/04/2011 Document Revised: 01/24/2016 Document Reviewed: 01/24/2016 Elsevier Interactive Patient Education  2017 [employer]  

## 2017-06-03 NOTE — Assessment & Plan Note (Signed)
Doing great! Continue current regimen. Continue to monitor. Call with any concerns.  

## 2017-06-03 NOTE — Assessment & Plan Note (Signed)
Doing great! Continue current regimen. Continue to monitor. Call with any concerns.

## 2017-06-06 IMAGING — US US ABDOMEN LIMITED
1 series · 14 of 25 positions shown · non-contrast
Comparison: MRI 07/27/2006.

CLINICAL DATA: Abdominal wall mass.

EXAM:
ABDOMEN ULTRASOUND COMPLETE

[Series 2: us abdomen limited · 0.17mm/px · 14 of 147 slices shown]
[im 1/147]
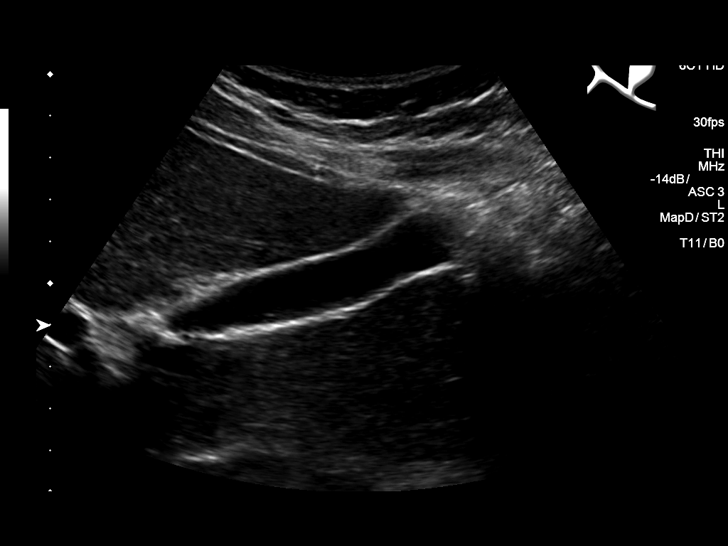
[im 13/147]
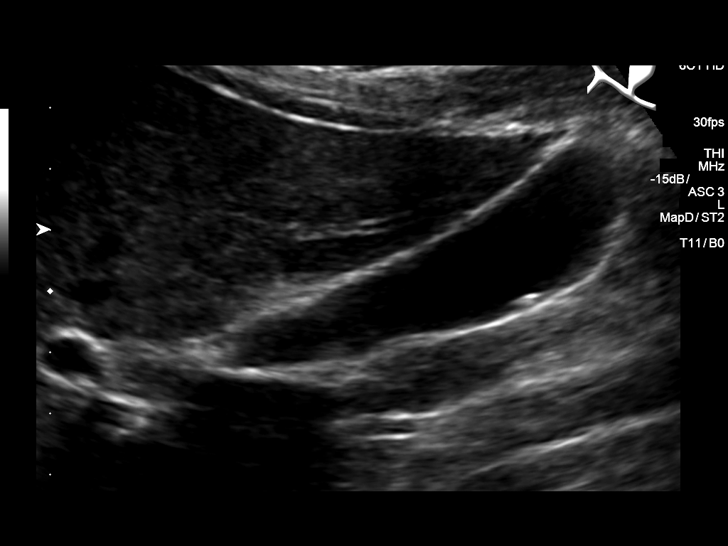
[im 25/147]
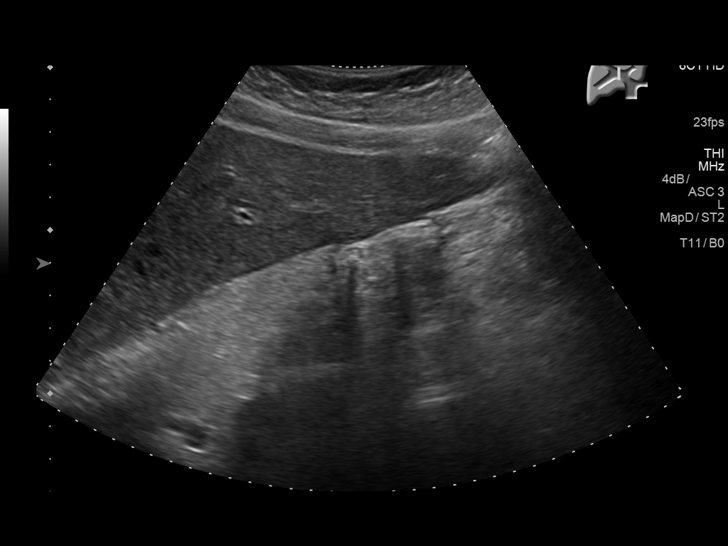
[im 37/147]
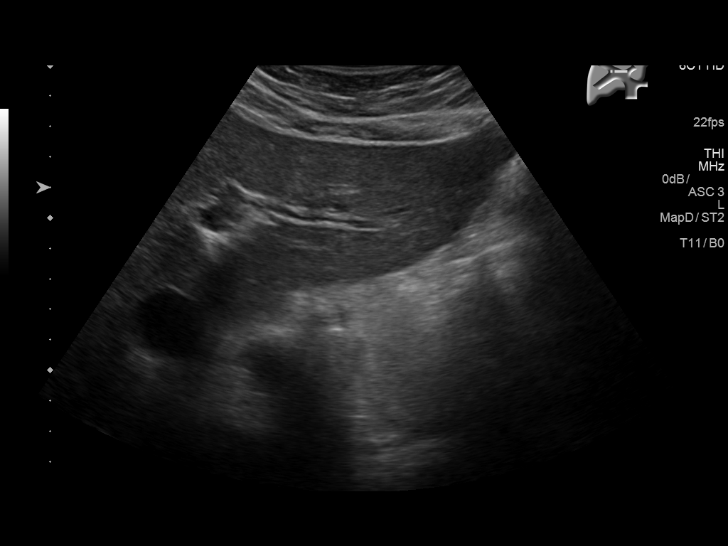
[im 49/147]
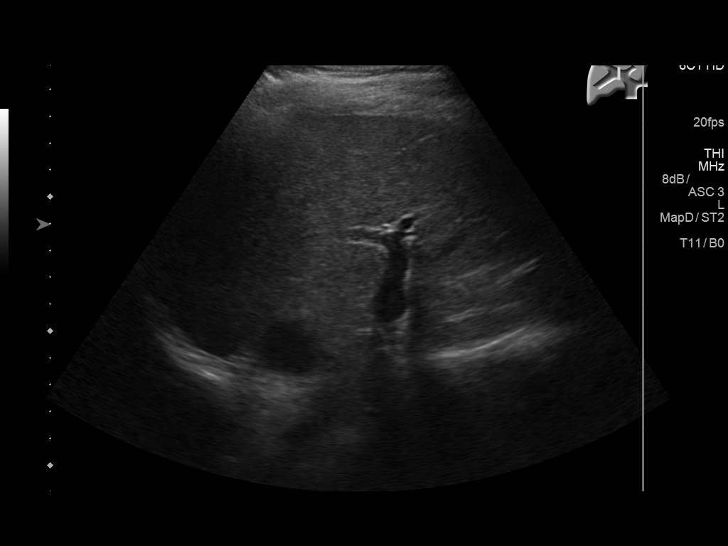
[im 55/147]
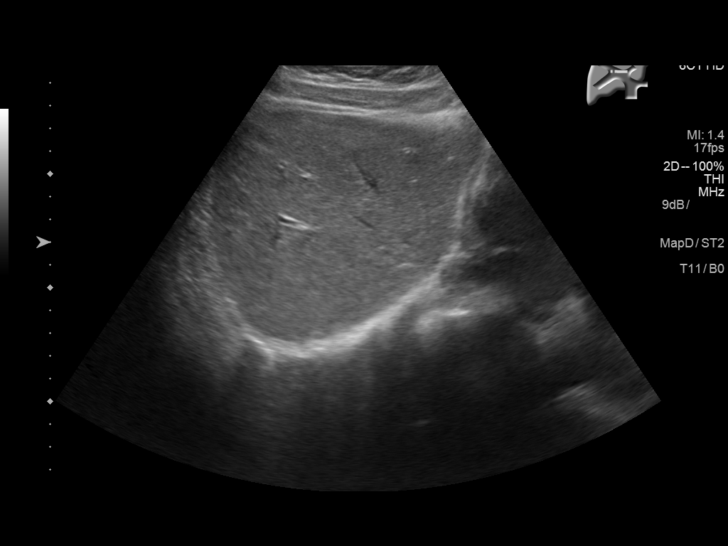
[im 67/147]
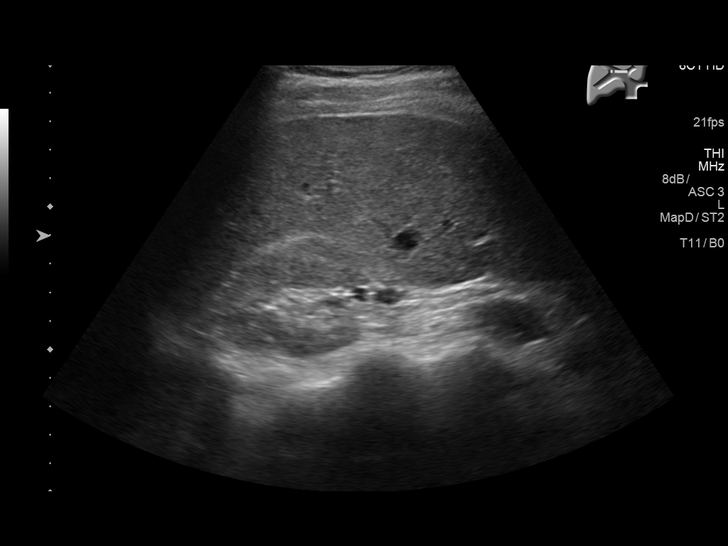
[im 80/147]
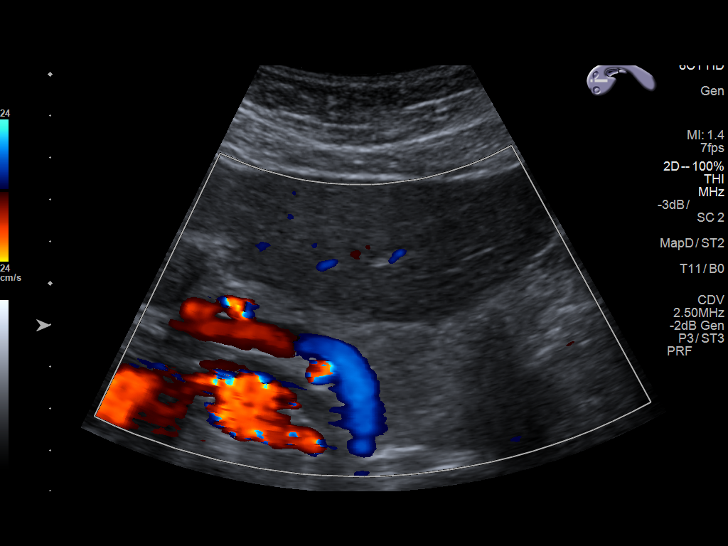
[im 92/147]
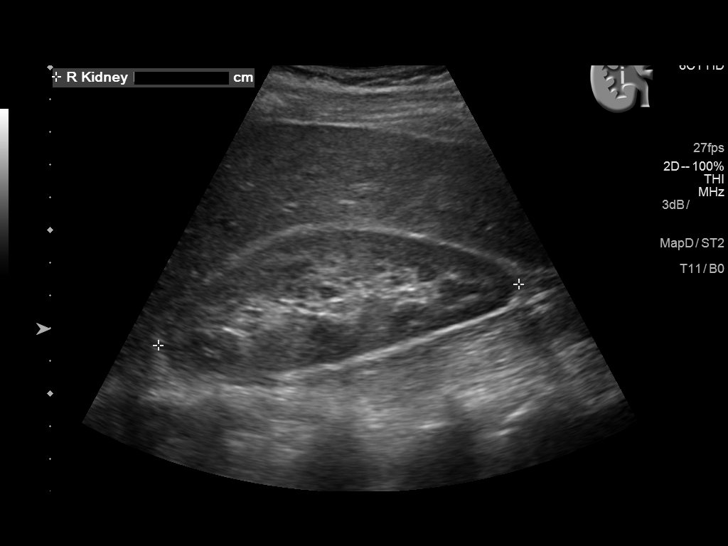
[im 98/147]
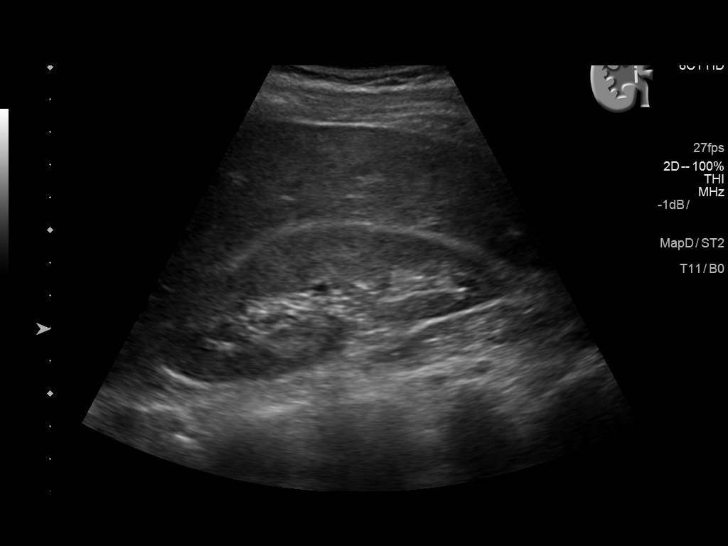
[im 110/147]
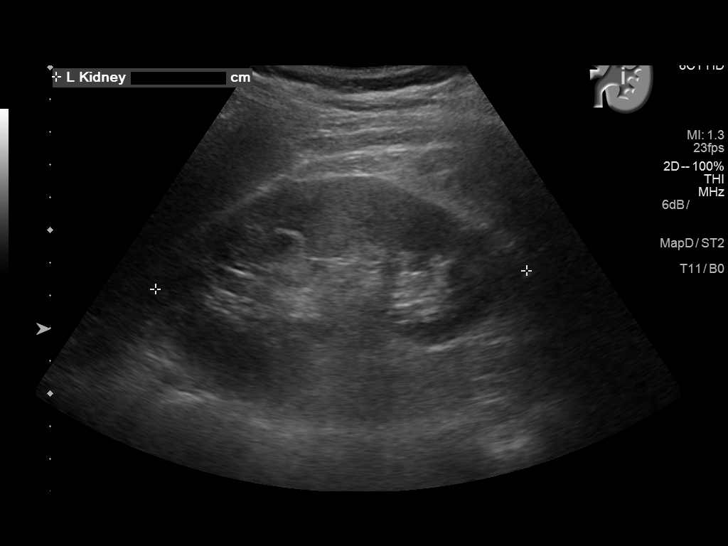
[im 122/147]
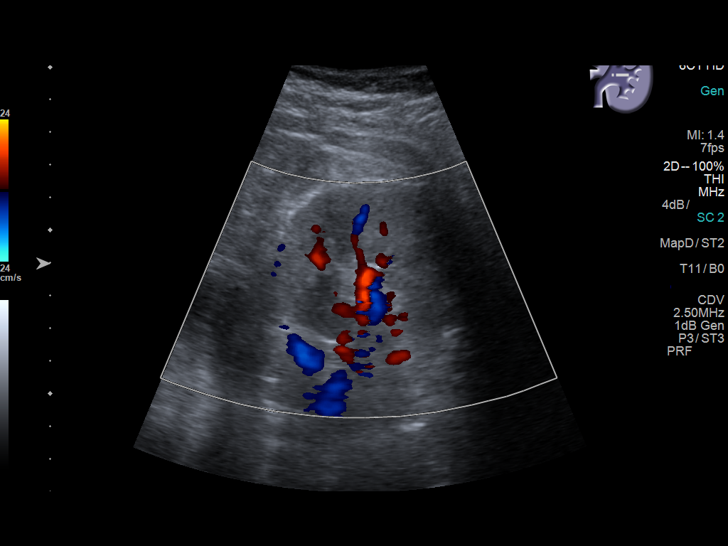
[im 134/147]
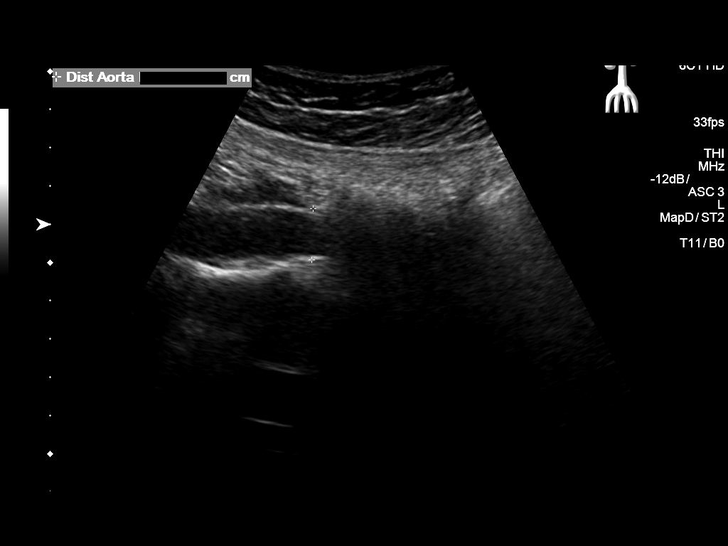
[im 147/147]
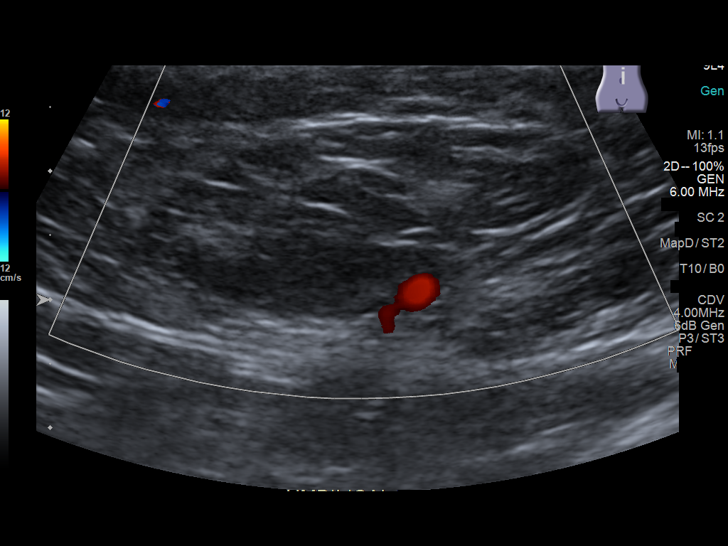

[14 of 25 positions shown; findings below may reference images not displayed]

FINDINGS: Gallbladder: Non mobile 3 mm echogenic non shadowing focus
consistent with tiny polyp.

Common bile duct: Diameter: 2 mm

Liver: No focal lesion identified. Within normal limits in
parenchymal echogenicity.

IVC: No abnormality visualized.

Pancreas: Visualized portion unremarkable.

Spleen: Size and appearance within normal limits.

Right Kidney: Length: 11.2 cm. Echogenicity within normal limits. No
mass or hydronephrosis visualized.

Left Kidney: Length: 11.4 cm. Echogenicity within normal limits. No
mass or hydronephrosis visualized.

Abdominal aorta: No aneurysm visualized.

Other findings: None.
IMPRESSION: Tiny gallbladder polyp, otherwise negative exam. Reference is made
to the limited abdominal wall ultrasound report. An abdominal wall
mass lesion is present.

## 2017-06-18 ENCOUNTER — Encounter: Payer: Self-pay | Admitting: Family Medicine

## 2017-06-18 ENCOUNTER — Ambulatory Visit: Payer: Managed Care, Other (non HMO) | Admitting: Family Medicine

## 2017-06-18 VITALS — BP 102/70 | HR 67 | Temp 98.0°F | Wt 129.1 lb

## 2017-06-18 DIAGNOSIS — J0100 Acute maxillary sinusitis, unspecified: Secondary | ICD-10-CM | POA: Diagnosis not present

## 2017-06-18 MED ORDER — AMOXICILLIN-POT CLAVULANATE 875-125 MG PO TABS: 1.0000 | ORAL_TABLET | Freq: Two times a day (BID) | ORAL | 0 refills | Status: DC

## 2017-06-18 NOTE — Progress Notes (Signed)
BP 102/70 (BP Location: Left Arm, Patient Position: Sitting, Cuff Size: Normal)   Pulse 67   Temp 98 F (36.7 C)   Wt 129 lb 2 oz (58.6 kg)   SpO2 99%   BMI 23.24 kg/m    Subjective:    Patient ID: Sandra Wright, female    DOB: [birth year], 42 y.o.   MRN: [id number]  HPI: Sandra Wright is a 42 y.o. female  Chief Complaint  Patient presents with  . URI   UPPER RESPIRATORY TRACT INFECTION Duration: over a month Worst symptom: drainage, congestion Fever: no Cough: yes Shortness of breath: no Wheezing: no Chest pain: no Chest tightness: no Chest congestion: no Nasal congestion: yes Runny nose: yes Post nasal drip: yes Sneezing: yes Sore throat: yes Swollen glands: yes Sinus pressure: yes Headache: yes Face pain: yes Toothache: no Ear pain: yes bilateral Ear pressure: yes bilateral Eyes red/itching:yes Eye drainage/crusting: no  Vomiting: no Rash: no Fatigue: yes Sick contacts: yes Strep contacts: no  Context: stable Recurrent sinusitis: no Relief with OTC cold/cough medications: no  Treatments attempted: tylenol and decongestion    Relevant past medical, surgical, family and social history reviewed and updated as indicated. Interim medical history since our last visit reviewed. Allergies and medications reviewed and updated.  Review of Systems  Constitutional: Positive for fatigue. Negative for activity change, appetite change, chills, diaphoresis, fever and unexpected weight change.  HENT: Positive for congestion, ear pain, postnasal drip, rhinorrhea, sinus pressure, sinus pain, sneezing and sore throat. Negative for dental problem, drooling, ear discharge, facial swelling, hearing loss, mouth sores, nosebleeds, tinnitus, trouble swallowing and voice change.   Eyes: Negative.   Respiratory: Negative.   Cardiovascular: Negative.   Psychiatric/Behavioral: Negative.     Per HPI unless specifically indicated above     Objective:    BP 102/70 (BP  Location: Left Arm, Patient Position: Sitting, Cuff Size: Normal)   Pulse 67   Temp 98 F (36.7 C)   Wt 129 lb 2 oz (58.6 kg)   SpO2 99%   BMI 23.24 kg/m   Wt Readings from Last 3 Encounters:  06/18/17 129 lb 2 oz (58.6 kg)  06/03/17 134 lb (60.8 kg)  03/16/17 127 lb 9 oz (57.9 kg)    Physical Exam  Constitutional: She is oriented to person, place, and time. She appears well-developed and well-nourished. No distress.  HENT:  Head: Normocephalic and atraumatic.  Right Ear: Hearing, tympanic membrane, external ear and ear canal normal.  Left Ear: Hearing, tympanic membrane, external ear and ear canal normal.  Nose: Mucosal edema and rhinorrhea present. Right sinus exhibits maxillary sinus tenderness. Right sinus exhibits no frontal sinus tenderness. Left sinus exhibits no maxillary sinus tenderness and no frontal sinus tenderness.  Mouth/Throat: Uvula is midline, oropharynx is clear and moist and mucous membranes are normal. No oropharyngeal exudate.  Eyes: Conjunctivae, EOM and lids are normal. Pupils are equal, round, and reactive to light. Right eye exhibits no discharge. Left eye exhibits no discharge. No scleral icterus.  Neck: Normal range of motion. Neck supple. No JVD present. No thyromegaly present.  Cardiovascular: Normal rate, regular rhythm, normal heart sounds and intact distal pulses. Exam reveals no gallop and no friction rub.  No murmur heard. Pulmonary/Chest: Effort normal and breath sounds normal. No stridor. No respiratory distress. She has no wheezes. She has no rales. She exhibits no tenderness.  Musculoskeletal: Normal range of motion.  Lymphadenopathy:    She has cervical adenopathy.  Neurological: She  is alert and oriented to person, place, and time.  Skin: Skin is warm, dry and intact. No rash noted. She is not diaphoretic. No erythema. No pallor.  Psychiatric: She has a normal mood and affect. Her speech is normal and behavior is normal. Judgment and thought  content normal. Cognition and memory are normal.    Results for orders placed or performed in visit on 03/16/17  CBC with Differential/Platelet  Result Value Ref Range   WBC 8.7 3.4 - 10.8 x10E3/uL   RBC 4.05 3.77 - 5.28 x10E6/uL   Hemoglobin 12.2 11.1 - 15.9 g/dL   Hematocrit 16.1 09.6 - 46.6 %   MCV 93 79 - 97 fL   MCH 30.1 26.6 - 33.0 pg   MCHC 32.3 31.5 - 35.7 g/dL   RDW 04.5 40.9 - 81.1 %   Platelets 343 150 - 379 x10E3/uL   Neutrophils 48 Not Estab. %   Lymphs 33 Not Estab. %   Monocytes 6 Not Estab. %   Eos 11 Not Estab. %   Basos 2 Not Estab. %   Neutrophils Absolute 4.2 1.4 - 7.0 x10E3/uL   Lymphocytes Absolute 2.9 0.7 - 3.1 x10E3/uL   Monocytes Absolute 0.5 0.1 - 0.9 x10E3/uL   EOS (ABSOLUTE) 1.0 (H) 0.0 - 0.4 x10E3/uL   Basophils Absolute 0.1 0.0 - 0.2 x10E3/uL   Immature Granulocytes 0 Not Estab. %   Immature Grans (Abs) 0.0 0.0 - 0.1 x10E3/uL  Thyroid Panel With TSH  Result Value Ref Range   TSH 1.090 0.450 - 4.500 uIU/mL   T4, Total 7.9 4.5 - 12.0 ug/dL   T3 Uptake Ratio 23 (L) 24 - 39 %   Free Thyroxine Index 1.8 1.2 - 4.9  Rocky mtn spotted fvr abs pnl(IgG+IgM)  Result Value Ref Range   RMSF IgG Negative Negative   RMSF IgM 0.25 0.00 - 0.89 index  Lyme Ab/Western Blot Reflex  Result Value Ref Range   Lyme IgG/IgM Ab <0.91 0.00 - 0.90 ISR   LYME DISEASE AB, QUANT, IGM <0.80 0.00 - 0.79 index  Ehrlichia Antibody Panel  Result Value Ref Range   E.Chaffeensis (HME) IgG Negative Neg:<1:64   E. Chaffeensis (HME) IgM Titer Negative Neg:<1:20   HGE IgG Titer Negative Neg:<1:64   HGE IgM Titer Negative Neg:<1:20  Babesia microti Antibody Panel  Result Value Ref Range   Babesia microti IgM <1:10 Neg:<1:10   Babesia microti IgG <1:10 Neg:<1:10  Iron and TIBC  Result Value Ref Range   Total Iron Binding Capacity 358 250 - 450 ug/dL   UIBC 914 782 - 956 ug/dL   Iron 84 27 - 213 ug/dL   Iron Saturation 23 15 - 55 %  B12 and Folate Panel  Result Value Ref  Range   Vitamin B-12 557 232 - 1,245 pg/mL   Folate 12.6 >3.0 ng/mL  Ferritin  Result Value Ref Range   Ferritin 61 15 - 150 ng/mL  VITAMIN D 25 Hydroxy (Vit-D Deficiency, Fractures)  Result Value Ref Range   Vit D, 25-Hydroxy 39.1 30.0 - 100.0 ng/mL      Assessment & Plan:   Problem List Items Addressed This Visit    None    Visit Diagnoses    Acute non-recurrent maxillary sinusitis    -  Primary   Will treat with augmentin. Call with any concern or if not getting better.    Relevant Medications   amoxicillin-clavulanate (AUGMENTIN) 875-125 MG tablet       Follow up  plan: Return if symptoms worsen or fail to improve.

## 2017-07-03 ENCOUNTER — Other Ambulatory Visit: Payer: Self-pay | Admitting: Obstetrics and Gynecology

## 2017-07-03 DIAGNOSIS — Z1239 Encounter for other screening for malignant neoplasm of breast: Secondary | ICD-10-CM

## 2017-08-03 ENCOUNTER — Ambulatory Visit
Admission: RE | Admit: 2017-08-03 | Discharge: 2017-08-03 | Disposition: A | Discharge status: Home / Self Care | Payer: Managed Care, Other (non HMO) | Source: Ambulatory Visit | Attending: Obstetrics and Gynecology | Admitting: Obstetrics and Gynecology

## 2017-08-03 DIAGNOSIS — Z1231 Encounter for screening mammogram for malignant neoplasm of breast: Secondary | ICD-10-CM | POA: Diagnosis present

## 2017-08-03 DIAGNOSIS — Z1239 Encounter for other screening for malignant neoplasm of breast: Secondary | ICD-10-CM

## 2017-09-10 ENCOUNTER — Encounter: Payer: Self-pay | Admitting: Family Medicine

## 2017-09-10 ENCOUNTER — Ambulatory Visit: Payer: Managed Care, Other (non HMO) | Admitting: Family Medicine

## 2017-09-10 VITALS — BP 115/74 | HR 58 | Temp 98.1°F | Wt 129.7 lb

## 2017-09-10 DIAGNOSIS — J301 Allergic rhinitis due to pollen: Secondary | ICD-10-CM

## 2017-09-10 DIAGNOSIS — J0100 Acute maxillary sinusitis, unspecified: Secondary | ICD-10-CM

## 2017-09-10 MED ORDER — FLUTICASONE PROPIONATE 50 MCG/ACT NA SUSP: 2.0000 | Freq: Two times a day (BID) | NASAL | 6 refills | Status: DC

## 2017-09-10 MED ORDER — AMOXICILLIN-POT CLAVULANATE 875-125 MG PO TABS: 1.0000 | ORAL_TABLET | Freq: Two times a day (BID) | ORAL | 0 refills | Status: DC

## 2017-09-10 NOTE — Progress Notes (Signed)
BP 115/74 (BP Location: Right Arm, Patient Position: Sitting, Cuff Size: Normal)   Pulse (!) 58   Temp 98.1 F (36.7 C) (Oral)   Wt 129 lb 11.2 oz (58.8 kg)   SpO2 99%   BMI 23.34 kg/m    Subjective:    Patient ID: Danna J [place], female    DOB: [birth year], 42 y.o.   MRN: [id number]  HPI: Sierra J [place] is a 42 y.o. female  Chief Complaint  Patient presents with  . Sinusitis    Ongoing for 1 week.  . Ear Pain  . Nasal Congestion  . Cough   Right sided facial pain and pressure, fatigue, HAs, ear pressure and pain, cough x 1 week. Denies CP, SOB, fevers, chills, body aches. Taking tylenol prn for symptomatic relief with minimal help. Lots of sick contacts.   Relevant past medical, surgical, family and social history reviewed and updated as indicated. Interim medical history since our last visit reviewed. Allergies and medications reviewed and updated.  Review of Systems  Per HPI unless specifically indicated above     Objective:    BP 115/74 (BP Location: Right Arm, Patient Position: Sitting, Cuff Size: Normal)   Pulse (!) 58   Temp 98.1 F (36.7 C) (Oral)   Wt 129 lb 11.2 oz (58.8 kg)   SpO2 99%   BMI 23.34 kg/m   Wt Readings from Last 3 Encounters:  09/10/17 129 lb 11.2 oz (58.8 kg)  06/18/17 129 lb 2 oz (58.6 kg)  06/03/17 134 lb (60.8 kg)    Physical Exam  Constitutional: She is oriented to person, place, and time. She appears well-developed and well-nourished. No distress.  HENT:  Head: Atraumatic.  B/l middle ear effusion B/l sinuses ttp Oropharynx and nasal mucosa erythematous and edematous with thick drainage present  Eyes: Pupils are equal, round, and reactive to light. Conjunctivae and EOM are normal.  Neck: Normal range of motion. Neck supple.  Cardiovascular: Normal rate and normal heart sounds.  Pulmonary/Chest: Effort normal and breath sounds normal. No respiratory distress.  Neurological: She is alert and oriented to person, place, and time.   Skin: Skin is warm and dry.  Psychiatric: Her behavior is normal.  Nursing note and vitals reviewed.   Results for orders placed or performed in visit on 03/16/17  CBC with Differential/Platelet  Result Value Ref Range   WBC 8.7 3.4 - 10.8 x10E3/uL   RBC 4.05 3.77 - 5.28 x10E6/uL   Hemoglobin 12.2 11.1 - 15.9 g/dL   Hematocrit 13.037.8 86.534.0 - 46.6 %   MCV 93 79 - 97 fL   MCH 30.1 26.6 - 33.0 pg   MCHC 32.3 31.5 - 35.7 g/dL   RDW 78.413.0 69.612.3 - 29.515.4 %   Platelets 343 150 - 379 x10E3/uL   Neutrophils 48 Not Estab. %   Lymphs 33 Not Estab. %   Monocytes 6 Not Estab. %   Eos 11 Not Estab. %   Basos 2 Not Estab. %   Neutrophils Absolute 4.2 1.4 - 7.0 x10E3/uL   Lymphocytes Absolute 2.9 0.7 - 3.1 x10E3/uL   Monocytes Absolute 0.5 0.1 - 0.9 x10E3/uL   EOS (ABSOLUTE) 1.0 (H) 0.0 - 0.4 x10E3/uL   Basophils Absolute 0.1 0.0 - 0.2 x10E3/uL   Immature Granulocytes 0 Not Estab. %   Immature Grans (Abs) 0.0 0.0 - 0.1 x10E3/uL  Thyroid Panel With TSH  Result Value Ref Range   TSH 1.090 0.450 - 4.500 uIU/mL   T4, Total 7.9  4.5 - 12.0 ug/dL   T3 Uptake Ratio 23 (L) 24 - 39 %   Free Thyroxine Index 1.8 1.2 - 4.9  Rocky mtn spotted fvr abs pnl(IgG+IgM)  Result Value Ref Range   RMSF IgG Negative Negative   RMSF IgM 0.25 0.00 - 0.89 index  Lyme Ab/Western Blot Reflex  Result Value Ref Range   Lyme IgG/IgM Ab <0.91 0.00 - 0.90 ISR   LYME DISEASE AB, QUANT, IGM <0.80 0.00 - 0.79 index  Ehrlichia Antibody Panel  Result Value Ref Range   E.Chaffeensis (HME) IgG Negative Neg:<1:64   E. Chaffeensis (HME) IgM Titer Negative Neg:<1:20   HGE IgG Titer Negative Neg:<1:64   HGE IgM Titer Negative Neg:<1:20  Babesia microti Antibody Panel  Result Value Ref Range   Babesia microti IgM <1:10 Neg:<1:10   Babesia microti IgG <1:10 Neg:<1:10  Iron and TIBC  Result Value Ref Range   Total Iron Binding Capacity 358 250 - 450 ug/dL   UIBC 161 096 - 045 ug/dL   Iron 84 27 - 409 ug/dL   Iron Saturation 23  15 - 55 %  B12 and Folate Panel  Result Value Ref Range   Vitamin B-12 557 232 - 1,245 pg/mL   Folate 12.6 >3.0 ng/mL  Ferritin  Result Value Ref Range   Ferritin 61 15 - 150 ng/mL  VITAMIN D 25 Hydroxy (Vit-D Deficiency, Fractures)  Result Value Ref Range   Vit D, 25-Hydroxy 39.1 30.0 - 100.0 ng/mL      Assessment & Plan:   Problem List Items Addressed This Visit      Respiratory   Allergic rhinitis    Continue singulair and flonase regimen, supportive care reviewed       Other Visit Diagnoses    Acute maxillary sinusitis, recurrence not specified    -  Primary   Will tx with augmentin, mucinex, allergy regimen, sinus rinses, humidifier. F/u if worsening or no improvement   Relevant Medications   amoxicillin-clavulanate (AUGMENTIN) 875-125 MG tablet   fluticasone (FLONASE) 50 MCG/ACT nasal spray       Follow up plan: Return if symptoms worsen or fail to improve.

## 2017-09-13 NOTE — Patient Instructions (Signed)
Follow up as needed

## 2017-09-13 NOTE — Assessment & Plan Note (Signed)
Continue singulair and flonase regimen, supportive care reviewed

## 2017-09-22 ENCOUNTER — Encounter: Payer: Managed Care, Other (non HMO) | Admitting: Family Medicine

## 2017-10-16 ENCOUNTER — Ambulatory Visit: Payer: Managed Care, Other (non HMO) | Admitting: Family Medicine

## 2017-10-16 ENCOUNTER — Encounter: Payer: Self-pay | Admitting: Family Medicine

## 2017-10-16 VITALS — BP 114/72 | HR 62 | Temp 98.3°F | Wt 129.0 lb

## 2017-10-16 DIAGNOSIS — J0100 Acute maxillary sinusitis, unspecified: Secondary | ICD-10-CM

## 2017-10-16 MED ORDER — PREDNISONE 50 MG PO TABS: 50.0000 mg | ORAL_TABLET | Freq: Every day | ORAL | 0 refills | Status: DC

## 2017-10-16 MED ORDER — DOXYCYCLINE HYCLATE 100 MG PO TABS
100.0000 mg | ORAL_TABLET | Freq: Two times a day (BID) | ORAL | 0 refills | Status: DC
Start: 2017-10-16 — End: 2017-11-13

## 2017-10-16 NOTE — Progress Notes (Signed)
BP 114/72 (BP Location: Right Arm, Patient Position: Sitting, Cuff Size: Normal)   Pulse 62   Temp 98.3 F (36.8 C)   Wt 129 lb (58.5 kg)   SpO2 100%   BMI 23.22 kg/m    Subjective:    Patient ID: Sandra Wright, female    DOB: 03/20/1976, 42 y.o.   MRN: [id number]  HPI: Sandra Wright is a 42 y.o. female  Chief Complaint  Patient presents with  . URI    sinus pressure, headache, mucus and cough   UPPER RESPIRATORY TRACT INFECTION Duration: 2-3 weeks Worst symptom: pressure in face, congestion Fever: no Cough: yes Shortness of breath: no Wheezing: no Chest pain: no Chest tightness: no Chest congestion: no Nasal congestion: yes Runny nose: no Post nasal drip: yes Sneezing: no Sore throat: yes Swollen glands: no Sinus pressure: yes Headache: yes Face pain: yes Toothache: yes Ear pain: no  Ear pressure: yes bilateral Eyes red/itching:no Eye drainage/crusting: no  Vomiting: no Rash: no Fatigue: yes Sick contacts: no Strep contacts: no  Context: stable Recurrent sinusitis: no Relief with OTC cold/cough medications: no  Treatments attempted: mucinex, anti-histamine, pseudoephedrine and antibiotics   Relevant past medical, surgical, family and social history reviewed and updated as indicated. Interim medical history since our last visit reviewed. Allergies and medications reviewed and updated.  Review of Systems  Constitutional: Positive for fatigue. Negative for activity change, appetite change, chills, diaphoresis, fever and unexpected weight change.  HENT: Positive for congestion, postnasal drip, rhinorrhea, sinus pressure and sinus pain. Negative for dental problem, drooling, ear discharge, ear pain, facial swelling, hearing loss, mouth sores, nosebleeds, sneezing, sore throat, tinnitus, trouble swallowing and voice change.   Respiratory: Positive for cough. Negative for shortness of breath, wheezing and stridor.   Cardiovascular: Negative.     Gastrointestinal: Negative.   Psychiatric/Behavioral: Negative.     Per HPI unless specifically indicated above     Objective:    BP 114/72 (BP Location: Right Arm, Patient Position: Sitting, Cuff Size: Normal)   Pulse 62   Temp 98.3 F (36.8 C)   Wt 129 lb (58.5 kg)   SpO2 100%   BMI 23.22 kg/m   Wt Readings from Last 3 Encounters:  10/16/17 129 lb (58.5 kg)  09/10/17 129 lb 11.2 oz (58.8 kg)  06/18/17 129 lb 2 oz (58.6 kg)    Physical Exam  Constitutional: She is oriented to person, place, and time. She appears well-developed and well-nourished. No distress.  HENT:  Head: Normocephalic and atraumatic.  Right Ear: Hearing, tympanic membrane, external ear and ear canal normal.  Left Ear: Hearing, tympanic membrane, external ear and ear canal normal.  Nose: Mucosal edema and rhinorrhea present. Right sinus exhibits maxillary sinus tenderness. Right sinus exhibits no frontal sinus tenderness. Left sinus exhibits maxillary sinus tenderness. Left sinus exhibits no frontal sinus tenderness.  Mouth/Throat: Uvula is midline, oropharynx is clear and moist and mucous membranes are normal. No oropharyngeal exudate.  Eyes: Pupils are equal, round, and reactive to light. Conjunctivae, EOM and lids are normal. Right eye exhibits no discharge. Left eye exhibits no discharge. No scleral icterus.  Neck: Normal range of motion. Neck supple. No JVD present. No tracheal deviation present. No thyromegaly present.  Cardiovascular: Normal rate, regular rhythm, normal heart sounds and intact distal pulses. Exam reveals no gallop and no friction rub.  No murmur heard. Pulmonary/Chest: Effort normal and breath sounds normal. No stridor. No respiratory distress. She has no wheezes. She has no  rales. She exhibits no tenderness.  Musculoskeletal: Normal range of motion.  Lymphadenopathy:    She has no cervical adenopathy.  Neurological: She is alert and oriented to person, place, and time.  Skin: Skin  is warm, dry and intact. Capillary refill takes less than 2 seconds. No rash noted. She is not diaphoretic. No erythema. No pallor.  Psychiatric: She has a normal mood and affect. Her speech is normal and behavior is normal. Judgment and thought content normal. Cognition and memory are normal.  Nursing note and vitals reviewed.   Results for orders placed or performed in visit on 03/16/17  CBC with Differential/Platelet  Result Value Ref Range   WBC 8.7 3.4 - 10.8 x10E3/uL   RBC 4.05 3.77 - 5.28 x10E6/uL   Hemoglobin 12.2 11.1 - 15.9 g/dL   Hematocrit 16.137.8 09.634.0 - 46.6 %   MCV 93 79 - 97 fL   MCH 30.1 26.6 - 33.0 pg   MCHC 32.3 31.5 - 35.7 g/dL   RDW 04.513.0 40.912.3 - 81.115.4 %   Platelets 343 150 - 379 x10E3/uL   Neutrophils 48 Not Estab. %   Lymphs 33 Not Estab. %   Monocytes 6 Not Estab. %   Eos 11 Not Estab. %   Basos 2 Not Estab. %   Neutrophils Absolute 4.2 1.4 - 7.0 x10E3/uL   Lymphocytes Absolute 2.9 0.7 - 3.1 x10E3/uL   Monocytes Absolute 0.5 0.1 - 0.9 x10E3/uL   EOS (ABSOLUTE) 1.0 (H) 0.0 - 0.4 x10E3/uL   Basophils Absolute 0.1 0.0 - 0.2 x10E3/uL   Immature Granulocytes 0 Not Estab. %   Immature Grans (Abs) 0.0 0.0 - 0.1 x10E3/uL  Thyroid Panel With TSH  Result Value Ref Range   TSH 1.090 0.450 - 4.500 uIU/mL   T4, Total 7.9 4.5 - 12.0 ug/dL   T3 Uptake Ratio 23 (L) 24 - 39 %   Free Thyroxine Index 1.8 1.2 - 4.9  Rocky mtn spotted fvr abs pnl(IgG+IgM)  Result Value Ref Range   RMSF IgG Negative Negative   RMSF IgM 0.25 0.00 - 0.89 index  Lyme Ab/Western Blot Reflex  Result Value Ref Range   Lyme IgG/IgM Ab <0.91 0.00 - 0.90 ISR   LYME DISEASE AB, QUANT, IGM <0.80 0.00 - 0.79 index  Ehrlichia Antibody Panel  Result Value Ref Range   E.Chaffeensis (HME) IgG Negative Neg:<1:64   E. Chaffeensis (HME) IgM Titer Negative Neg:<1:20   HGE IgG Titer Negative Neg:<1:64   HGE IgM Titer Negative Neg:<1:20  Babesia microti Antibody Panel  Result Value Ref Range   Babesia microti  IgM <1:10 Neg:<1:10   Babesia microti IgG <1:10 Neg:<1:10  Iron and TIBC  Result Value Ref Range   Total Iron Binding Capacity 358 250 - 450 ug/dL   UIBC 914274 782131 - 956425 ug/dL   Iron 84 27 - 213159 ug/dL   Iron Saturation 23 15 - 55 %  B12 and Folate Panel  Result Value Ref Range   Vitamin B-12 557 232 - 1,245 pg/mL   Folate 12.6 >3.0 ng/mL  Ferritin  Result Value Ref Range   Ferritin 61 15 - 150 ng/mL  VITAMIN D 25 Hydroxy (Vit-D Deficiency, Fractures)  Result Value Ref Range   Vit D, 25-Hydroxy 39.1 30.0 - 100.0 ng/mL      Assessment & Plan:   Problem List Items Addressed This Visit    None    Visit Diagnoses    Acute maxillary sinusitis, recurrence not specified    -  Primary   Will treat with doxycycline and prednisone. Call with any concerns or if not getting better.    Relevant Medications   doxycycline (VIBRA-TABS) 100 MG tablet   predniSONE (DELTASONE) 50 MG tablet       Follow up plan: Return if symptoms worsen or fail to improve.

## 2017-11-13 ENCOUNTER — Ambulatory Visit: Payer: Managed Care, Other (non HMO) | Admitting: Family Medicine

## 2017-11-13 ENCOUNTER — Encounter: Payer: Self-pay | Admitting: Family Medicine

## 2017-11-13 VITALS — BP 104/66 | HR 56 | Temp 98.2°F | Wt 127.3 lb

## 2017-11-13 DIAGNOSIS — J301 Allergic rhinitis due to pollen: Secondary | ICD-10-CM | POA: Diagnosis not present

## 2017-11-13 MED ORDER — CETIRIZINE HCL 10 MG PO TABS: 10.0000 mg | ORAL_TABLET | Freq: Every day | ORAL | 11 refills | Status: DC

## 2017-11-13 NOTE — Progress Notes (Signed)
BP 104/66 (BP Location: Left Arm, Patient Position: Sitting, Cuff Size: Normal)   Pulse (!) 56   Temp 98.2 F (36.8 C)   Wt 127 lb 5 oz (57.7 kg)   SpO2 95%   BMI 22.91 kg/m    Subjective:    Patient ID: Sandra Wright, female    DOB: 08-01-1975, 42 y.o.   MRN: [id number]  HPI: Sandra Wright is a 42 y.o. female  Chief Complaint  Patient presents with  . Sinusitis    Recheck, patient still has sinus pressure and other symptoms.   Still not better after her sinus infection in May. Was better for about 2 weeks, then everything started to come back. Has been having pressure, runny nose, no fever. No SOB  UPPER RESPIRATORY TRACT INFECTION Duration: 2 weeks Worst symptom: congestion Fever: no Cough: yes Shortness of breath: no Wheezing: no Chest pain: no Chest tightness: no Chest congestion: no Nasal congestion: yes Runny nose: yes Post nasal drip: yes Sneezing: no Sore throat: no Swollen glands: no Sinus pressure: yes Headache: yes Face pain: no Toothache: no Ear pain: no  Ear pressure: yes bilateral Eyes red/itching:no Eye drainage/crusting: no  Vomiting: no Rash: no Fatigue: yes Sick contacts: yes Strep contacts: no  Context: stable Recurrent sinusitis: no Relief with OTC cold/cough medications: no  Treatments attempted: antibiotics   Relevant past medical, surgical, family and social history reviewed and updated as indicated. Interim medical history since our last visit reviewed. Allergies and medications reviewed and updated.  Review of Systems  Constitutional: Negative.   HENT: Positive for congestion, postnasal drip, rhinorrhea and sinus pressure. Negative for dental problem, drooling, ear discharge, ear pain, facial swelling, hearing loss, mouth sores, nosebleeds, sinus pain, sneezing, sore throat, tinnitus, trouble swallowing and voice change.   Eyes: Negative.   Respiratory: Negative.   Cardiovascular: Negative.   Psychiatric/Behavioral:  Negative.     Per HPI unless specifically indicated above     Objective:    BP 104/66 (BP Location: Left Arm, Patient Position: Sitting, Cuff Size: Normal)   Pulse (!) 56   Temp 98.2 F (36.8 C)   Wt 127 lb 5 oz (57.7 kg)   SpO2 95%   BMI 22.91 kg/m   Wt Readings from Last 3 Encounters:  11/13/17 127 lb 5 oz (57.7 kg)  10/16/17 129 lb (58.5 kg)  09/10/17 129 lb 11.2 oz (58.8 kg)    Physical Exam  Constitutional: She is oriented to person, place, and time. She appears well-developed and well-nourished. No distress.  HENT:  Head: Normocephalic and atraumatic.  Right Ear: Hearing and external ear normal.  Left Ear: Hearing and external ear normal.  Nose: Nose normal.  Mouth/Throat: Oropharynx is clear and moist. No oropharyngeal exudate.  Eyes: Pupils are equal, round, and reactive to light. Conjunctivae, EOM and lids are normal. Right eye exhibits no discharge. Left eye exhibits no discharge. No scleral icterus.  Neck: Normal range of motion. Neck supple. No JVD present. No tracheal deviation present. No thyromegaly present.  Cardiovascular: Normal rate, regular rhythm, normal heart sounds and intact distal pulses. Exam reveals no gallop and no friction rub.  No murmur heard. Pulmonary/Chest: Effort normal and breath sounds normal. No stridor. No respiratory distress. She has no wheezes. She has no rales. She exhibits no tenderness.  Musculoskeletal: Normal range of motion.  Lymphadenopathy:    She has no cervical adenopathy.  Neurological: She is alert and oriented to person, place, and time.  Skin: Skin  is warm, dry and intact. Capillary refill takes less than 2 seconds. No rash noted. She is not diaphoretic. No erythema. No pallor.  Psychiatric: She has a normal mood and affect. Her speech is normal and behavior is normal. Judgment and thought content normal. Cognition and memory are normal.  Nursing note and vitals reviewed.   Results for orders placed or performed in  visit on 03/16/17  CBC with Differential/Platelet  Result Value Ref Range   WBC 8.7 3.4 - 10.8 x10E3/uL   RBC 4.05 3.77 - 5.28 x10E6/uL   Hemoglobin 12.2 11.1 - 15.9 g/dL   Hematocrit 16.137.8 09.634.0 - 46.6 %   MCV 93 79 - 97 fL   MCH 30.1 26.6 - 33.0 pg   MCHC 32.3 31.5 - 35.7 g/dL   RDW 04.513.0 40.912.3 - 81.115.4 %   Platelets 343 150 - 379 x10E3/uL   Neutrophils 48 Not Estab. %   Lymphs 33 Not Estab. %   Monocytes 6 Not Estab. %   Eos 11 Not Estab. %   Basos 2 Not Estab. %   Neutrophils Absolute 4.2 1.4 - 7.0 x10E3/uL   Lymphocytes Absolute 2.9 0.7 - 3.1 x10E3/uL   Monocytes Absolute 0.5 0.1 - 0.9 x10E3/uL   EOS (ABSOLUTE) 1.0 (H) 0.0 - 0.4 x10E3/uL   Basophils Absolute 0.1 0.0 - 0.2 x10E3/uL   Immature Granulocytes 0 Not Estab. %   Immature Grans (Abs) 0.0 0.0 - 0.1 x10E3/uL  Thyroid Panel With TSH  Result Value Ref Range   TSH 1.090 0.450 - 4.500 uIU/mL   T4, Total 7.9 4.5 - 12.0 ug/dL   T3 Uptake Ratio 23 (L) 24 - 39 %   Free Thyroxine Index 1.8 1.2 - 4.9  Rocky mtn spotted fvr abs pnl(IgG+IgM)  Result Value Ref Range   RMSF IgG Negative Negative   RMSF IgM 0.25 0.00 - 0.89 index  Lyme Ab/Western Blot Reflex  Result Value Ref Range   Lyme IgG/IgM Ab <0.91 0.00 - 0.90 ISR   LYME DISEASE AB, QUANT, IGM <0.80 0.00 - 0.79 index  Ehrlichia Antibody Panel  Result Value Ref Range   E.Chaffeensis (HME) IgG Negative Neg:<1:64   E. Chaffeensis (HME) IgM Titer Negative Neg:<1:20   HGE IgG Titer Negative Neg:<1:64   HGE IgM Titer Negative Neg:<1:20  Babesia microti Antibody Panel  Result Value Ref Range   Babesia microti IgM <1:10 Neg:<1:10   Babesia microti IgG <1:10 Neg:<1:10  Iron and TIBC  Result Value Ref Range   Total Iron Binding Capacity 358 250 - 450 ug/dL   UIBC 914274 782131 - 956425 ug/dL   Iron 84 27 - 213159 ug/dL   Iron Saturation 23 15 - 55 %  B12 and Folate Panel  Result Value Ref Range   Vitamin B-12 557 232 - 1,245 pg/mL   Folate 12.6 >3.0 ng/mL  Ferritin  Result Value Ref  Range   Ferritin 61 15 - 150 ng/mL  VITAMIN D 25 Hydroxy (Vit-D Deficiency, Fractures)  Result Value Ref Range   Vit D, 25-Hydroxy 39.1 30.0 - 100.0 ng/mL      Assessment & Plan:   Problem List Items Addressed This Visit      Respiratory   Allergic rhinitis - Primary       Follow up plan: Return As scheduled.

## 2018-01-04 ENCOUNTER — Other Ambulatory Visit: Payer: Self-pay | Admitting: Family Medicine

## 2018-01-13 ENCOUNTER — Encounter: Payer: Self-pay | Admitting: Family Medicine

## 2018-02-25 ENCOUNTER — Encounter: Payer: Self-pay | Admitting: Family Medicine

## 2018-02-25 ENCOUNTER — Ambulatory Visit: Payer: Managed Care, Other (non HMO) | Admitting: Family Medicine

## 2018-02-25 VITALS — BP 123/79 | HR 78 | Temp 98.6°F | Wt 135.0 lb

## 2018-02-25 DIAGNOSIS — J0100 Acute maxillary sinusitis, unspecified: Secondary | ICD-10-CM

## 2018-02-25 MED ORDER — AMOXICILLIN-POT CLAVULANATE 875-125 MG PO TABS: 1.0000 | ORAL_TABLET | Freq: Two times a day (BID) | ORAL | 0 refills | Status: DC

## 2018-02-25 NOTE — Progress Notes (Signed)
BP 123/79 (BP Location: Left Arm, Patient Position: Sitting, Cuff Size: Normal)   Pulse 78   Temp 98.6 F (37 C)   Wt 135 lb (61.2 kg)   BMI 24.30 kg/m    Subjective:    Patient ID: Sandra Wright, female    DOB: 09/12/1975, 42 y.o.   MRN: [id number]  HPI: Sandra Wright is a 42 y.o. female  Chief Complaint  Patient presents with  . URI    x 1 week   UPPER RESPIRATORY TRACT INFECTION Duration: 1 week Worst symptom: congstion Fever: no Cough: yes Shortness of breath: no Wheezing: no Chest pain: no Chest tightness: no Chest congestion: no Nasal congestion: yes Runny nose: yes Post nasal drip: yes Sneezing: yes Sore throat: yes Swollen glands: no Sinus pressure: yes Headache: yes Face pain: yes Toothache: no Ear pain: no  Ear pressure: no  Eyes red/itching:no Eye drainage/crusting: yes  Vomiting: no Rash: no Fatigue: yes Sick contacts: yes Strep contacts: no  Context: worse Recurrent sinusitis: no Relief with OTC cold/cough medications: no  Treatments attempted: cold and sinus, flonase, allergy meds   Relevant past medical, surgical, family and social history reviewed and updated as indicated. Interim medical history since our last visit reviewed. Allergies and medications reviewed and updated.  Review of Systems  Constitutional: Positive for fatigue. Negative for activity change, appetite change, chills, diaphoresis, fever and unexpected weight change.  HENT: Positive for congestion, postnasal drip, rhinorrhea, sinus pressure, sinus pain and sore throat. Negative for dental problem, drooling, ear discharge, ear pain, facial swelling, hearing loss, mouth sores, nosebleeds, sneezing, tinnitus, trouble swallowing and voice change.   Eyes: Negative.   Respiratory: Negative.   Cardiovascular: Negative.   Psychiatric/Behavioral: Negative.     Per HPI unless specifically indicated above     Objective:    BP 123/79 (BP Location: Left Arm, Patient  Position: Sitting, Cuff Size: Normal)   Pulse 78   Temp 98.6 F (37 C)   Wt 135 lb (61.2 kg)   BMI 24.30 kg/m   Wt Readings from Last 3 Encounters:  02/25/18 135 lb (61.2 kg)  11/13/17 127 lb 5 oz (57.7 kg)  10/16/17 129 lb (58.5 kg)    Physical Exam  Constitutional: She is oriented to person, place, and time. She appears well-developed and well-nourished. No distress.  HENT:  Head: Normocephalic and atraumatic.  Right Ear: Hearing, tympanic membrane, external ear and ear canal normal.  Left Ear: Hearing, tympanic membrane, external ear and ear canal normal.  Nose: Mucosal edema and rhinorrhea present. Right sinus exhibits maxillary sinus tenderness.  Mouth/Throat: Uvula is midline, oropharynx is clear and moist and mucous membranes are normal. No oropharyngeal exudate.  Eyes: Pupils are equal, round, and reactive to light. Conjunctivae, EOM and lids are normal. Right eye exhibits no discharge. Left eye exhibits no discharge. No scleral icterus.  Neck: Normal range of motion. Neck supple. No JVD present. No tracheal deviation present. No thyromegaly present.  Cardiovascular: Normal rate, regular rhythm, normal heart sounds and intact distal pulses. Exam reveals no gallop and no friction rub.  No murmur heard. Pulmonary/Chest: Effort normal and breath sounds normal. No stridor. No respiratory distress. She has no wheezes. She has no rales. She exhibits no tenderness.  Musculoskeletal: Normal range of motion.  Lymphadenopathy:    She has no cervical adenopathy.  Neurological: She is alert and oriented to person, place, and time.  Skin: Skin is intact. No rash noted. She is not diaphoretic.  Psychiatric: She has a normal mood and affect. Her speech is normal and behavior is normal. Judgment and thought content normal. Cognition and memory are normal.  Nursing note and vitals reviewed.   Results for orders placed or performed in visit on 01/13/18  HM PAP SMEAR  Result Value Ref Range     HM Pap smear negative with negative HPV       Assessment & Plan:   Problem List Items Addressed This Visit    None    Visit Diagnoses    Acute maxillary sinusitis, recurrence not specified    -  Primary   Will treat with augmentin. Call with any concern. Continue to monitor.    Relevant Medications   amoxicillin-clavulanate (AUGMENTIN) 875-125 MG tablet       Follow up plan: Return if symptoms worsen or fail to improve.

## 2018-06-24 ENCOUNTER — Other Ambulatory Visit: Payer: Self-pay | Admitting: Family Medicine

## 2018-06-24 NOTE — Telephone Encounter (Signed)
Requested medication (s) are due for refill today: yes  Requested medication (s) are on the active medication list: yes  Last refill:  06/03/17  Future visit scheduled: no  Notes to clinic:  Pt has been off medication for a  year   Requested Prescriptions  Pending Prescriptions Disp Refills   citalopram (CELEXA) 40 MG tablet [Pharmacy Med Name: Citalopram Hydrobromide 40 MG Oral Tablet] 30 tablet 0    Sig: TAKE 1 TABLET BY MOUTH ONCE DAILY     Psychiatry:  Antidepressants - SSRI Passed - 06/24/2018  7:25 AM      Passed - Completed PHQ-2 or PHQ-9 in the last 360 days.      Passed - Valid encounter within last 6 months    Recent Outpatient Visits          3 months ago Acute maxillary sinusitis, recurrence not specified   [facility] [place]   7 months ago Allergic rhinitis due to pollen, unspecified seasonality   [facility]   8 months ago Acute maxillary sinusitis, recurrence not specified   [facility] [place] [place]   9 months ago Acute maxillary sinusitis, recurrence not specified   [facility] [place]   1 year ago Acute non-recurrent maxillary sinusitis   [facility] [place]

## 2018-07-13 ENCOUNTER — Encounter: Payer: Self-pay | Admitting: Family Medicine

## 2018-07-13 ENCOUNTER — Ambulatory Visit: Payer: Managed Care, Other (non HMO) | Admitting: Family Medicine

## 2018-07-13 VITALS — BP 102/66 | HR 70 | Temp 98.7°F | Ht 62.5 in | Wt 138.0 lb

## 2018-07-13 DIAGNOSIS — J0100 Acute maxillary sinusitis, unspecified: Secondary | ICD-10-CM | POA: Diagnosis not present

## 2018-07-13 MED ORDER — AMOXICILLIN-POT CLAVULANATE 875-125 MG PO TABS: 1.0000 | ORAL_TABLET | Freq: Two times a day (BID) | ORAL | 0 refills | Status: DC

## 2018-07-13 NOTE — Progress Notes (Signed)
BP 102/66 (BP Location: Left Arm, Patient Position: Sitting, Cuff Size: Normal)   Pulse 70   Temp 98.7 F (37.1 C) (Oral)   Ht 5' 2.5" (1.588 m)   Wt 138 lb (62.6 kg)   SpO2 99%   BMI 24.84 kg/m    Subjective:    Patient ID: Sandra Wright, female    DOB: 08/20/1975, 43 y.o.   MRN: [id number]  HPI: Sandra Wright is a 43 y.o. female  Chief Complaint  Patient presents with  . Sinusitis    Ongoing 2 weeks at least. Symptoms worsened this week.  . Nasal Congestion  . Headache  . Cough   UPPER RESPIRATORY TRACT INFECTION Duration: 2 weeks Worst symptom: congestion Fever: no Cough: yes Shortness of breath: no Wheezing: no Chest pain: no Chest tightness: no Chest congestion: no Nasal congestion: yes Runny nose: yes Post nasal drip: yes Sneezing: no Sore throat: yes Swollen glands: no Sinus pressure: yes Headache: yes Face pain: yes Toothache: no Ear pain: no  Ear pressure: yes bilateral Eyes red/itching:no Eye drainage/crusting: no  Vomiting: no Rash: no Fatigue: yes Sick contacts: yes Strep contacts: no  Context: worse Recurrent sinusitis: no Relief with OTC cold/cough medications: no  Treatments attempted: cold/sinus and pseudoephedrine   Relevant past medical, surgical, family and social history reviewed and updated as indicated. Interim medical history since our last visit reviewed. Allergies and medications reviewed and updated.  Review of Systems  Constitutional: Positive for fatigue. Negative for activity change, appetite change, chills, diaphoresis, fever and unexpected weight change.  HENT: Positive for congestion, postnasal drip, rhinorrhea, sinus pressure, sinus pain and sore throat. Negative for dental problem, drooling, ear discharge, ear pain, facial swelling, hearing loss, mouth sores, nosebleeds, sneezing, tinnitus, trouble swallowing and voice change.   Eyes: Negative.   Respiratory: Negative.   Cardiovascular: Negative.     Gastrointestinal: Negative.   Musculoskeletal: Negative.   Neurological: Negative.   Psychiatric/Behavioral: Negative.     Per HPI unless specifically indicated above     Objective:    BP 102/66 (BP Location: Left Arm, Patient Position: Sitting, Cuff Size: Normal)   Pulse 70   Temp 98.7 F (37.1 C) (Oral)   Ht 5' 2.5" (1.588 m)   Wt 138 lb (62.6 kg)   SpO2 99%   BMI 24.84 kg/m   Wt Readings from Last 3 Encounters:  07/13/18 138 lb (62.6 kg)  02/25/18 135 lb (61.2 kg)  11/13/17 127 lb 5 oz (57.7 kg)    Physical Exam Vitals signs and nursing note reviewed.  Constitutional:      General: She is not in acute distress.    Appearance: Normal appearance. She is not ill-appearing, toxic-appearing or diaphoretic.  HENT:     Head: Normocephalic and atraumatic.     Right Ear: Tympanic membrane, ear canal and external ear normal. There is no impacted cerumen.     Left Ear: Tympanic membrane, ear canal and external ear normal. There is no impacted cerumen.     Nose: Nasal tenderness, congestion and rhinorrhea present.     Right Sinus: Maxillary sinus tenderness present.     Mouth/Throat:     Mouth: Mucous membranes are moist.     Pharynx: Oropharynx is clear. No oropharyngeal exudate or posterior oropharyngeal erythema.  Eyes:     General: No scleral icterus.       Right eye: No discharge.        Left eye: No discharge.  Extraocular Movements: Extraocular movements intact.     Conjunctiva/sclera: Conjunctivae normal.     Pupils: Pupils are equal, round, and reactive to light.  Neck:     Musculoskeletal: Normal range of motion and neck supple. No neck rigidity or muscular tenderness.     Vascular: No carotid bruit.  Cardiovascular:     Rate and Rhythm: Normal rate and regular rhythm.     Pulses: Normal pulses.     Heart sounds: Normal heart sounds. No murmur. No friction rub. No gallop.   Pulmonary:     Effort: Pulmonary effort is normal. No respiratory distress.      Breath sounds: Normal breath sounds. No stridor. No wheezing, rhonchi or rales.  Chest:     Chest wall: No tenderness.  Musculoskeletal: Normal range of motion.  Lymphadenopathy:     Cervical: Cervical adenopathy present.  Skin:    General: Skin is warm and dry.     Capillary Refill: Capillary refill takes less than 2 seconds.     Coloration: Skin is not jaundiced or pale.     Findings: No bruising, erythema, lesion or rash.  Neurological:     General: No focal deficit present.     Mental Status: She is alert and oriented to person, place, and time. Mental status is at baseline.     Cranial Nerves: No cranial nerve deficit.     Sensory: No sensory deficit.     Motor: No weakness.     Coordination: Coordination normal.     Gait: Gait normal.     Deep Tendon Reflexes: Reflexes normal.  Psychiatric:        Mood and Affect: Mood normal.        Behavior: Behavior normal.        Thought Content: Thought content normal.        Judgment: Judgment normal.     Results for orders placed or performed in visit on 01/13/18  HM PAP SMEAR  Result Value Ref Range   HM Pap smear negative with negative HPV       Assessment & Plan:   Problem List Items Addressed This Visit    None    Visit Diagnoses    Acute maxillary sinusitis, recurrence not specified    -  Primary   Will treat with augmentin. Call with any concerns. Continue to monitor.    Relevant Medications   amoxicillin-clavulanate (AUGMENTIN) 875-125 MG tablet       Follow up plan: Return if symptoms worsen or fail to improve.

## 2018-07-27 ENCOUNTER — Other Ambulatory Visit: Payer: Self-pay | Admitting: Family Medicine

## 2018-07-27 NOTE — Telephone Encounter (Signed)
Pt called back; she says that that she has been on medication since last year and it is working well; will route to office for recommended follow up dates; the pt can be contacted at [phone], and a detailed message can be left on voicemail.

## 2018-07-27 NOTE — Telephone Encounter (Signed)
Refill request for Citalopram; last refill 06/24/2018 # 30;pt had been off medication for 1 year; no upcoming follow up visits noted; attempted to contact pt; left message on voicemail [phone]; will route to office for final disposition.  Requested medication (s) are due for refill today: yes  Requested medication (s) are on the active medication list: yes  Last refill:  06/24/2018  Future visit scheduled: no  Notes to clinic:  No follow up visits noted

## 2018-07-27 NOTE — Telephone Encounter (Signed)
Please get her scheduled for a follow up appointment. Thanks.

## 2018-07-28 ENCOUNTER — Encounter: Payer: Self-pay | Admitting: Family Medicine

## 2018-07-28 NOTE — Telephone Encounter (Signed)
LVM for pt to call back and also mailing a letter.

## 2018-08-09 NOTE — Progress Notes (Deleted)
   There were no vitals taken for this visit.   Subjective:    Patient ID: Sandra Wright, female    DOB: Nov 02, 1975, 43 y.o.   MRN: [id number]  HPI: Sandra Wright is a 43 y.o. female  No chief complaint on file.  DEPRESSION Mood status: {Blank single:19197::"controlled","uncontrolled","better","worse","exacerbated","stable"} Satisfied with current treatment?: {Blank single:19197::"yes","no"} Symptom severity: {Blank single:19197::"mild","moderate","severe"}  Duration of current treatment : {Blank single:19197::"chronic","months","years"} Side effects: {Blank single:19197::"yes","no"} Medication compliance: {Blank single:19197::"excellent compliance","good compliance","fair compliance","poor compliance"} Psychotherapy/counseling: {Blank single:19197::"yes","no"} {Blank single:19197::"current","in the past"} Previous psychiatric medications: {Blank multiple:19196::"abilify","amitryptiline","buspar","celexa","cymbalta","depakote","effexor","lamictal","lexapro","lithium","nortryptiline","paxil","prozac","pristiq (desvenlafaxine","seroquel","wellbutrin","zoloft","zyprexa"} Depressed mood: {Blank single:19197::"yes","no"} Anxious mood: {Blank single:19197::"yes","no"} Anhedonia: {Blank single:19197::"yes","no"} Significant weight loss or gain: {Blank single:19197::"yes","no"} Insomnia: {Blank single:19197::"yes","no"} {Blank single:19197::"hard to fall asleep","hard to stay asleep"} Fatigue: {Blank single:19197::"yes","no"} Feelings of worthlessness or guilt: {Blank single:19197::"yes","no"} Impaired concentration/indecisiveness: {Blank single:19197::"yes","no"} Suicidal ideations: {Blank single:19197::"yes","no"} Hopelessness: {Blank single:19197::"yes","no"} Crying spells: {Blank single:19197::"yes","no"} Depression screen [facility] 2/9 10/16/2017 06/03/2017 03/16/2017 11/27/2016 09/18/2016  Decreased Interest 0 0 0 0 1  Down, Depressed, Hopeless 0 0 0 0 0  PHQ - 2 Score 0 0 0 0 1  Altered  sleeping 0 0 0 0 -  Tired, decreased energy 1 2 1  0 -  Change in appetite 0 0 0 0 -  Feeling bad or failure about yourself  0 0 0 0 -  Trouble concentrating 0 0 0 0 -  Moving slowly or fidgety/restless 0 0 1 0 -  Suicidal thoughts 0 0 0 0 -  PHQ-9 Score 1 2 2  0 -  Difficult doing work/chores Not difficult at all - Somewhat difficult - -    Relevant past medical, surgical, family and social history reviewed and updated as indicated. Interim medical history since our last visit reviewed. Allergies and medications reviewed and updated.  Review of Systems  Per HPI unless specifically indicated above     Objective:    There were no vitals taken for this visit.  Wt Readings from Last 3 Encounters:  07/13/18 138 lb (62.6 kg)  02/25/18 135 lb (61.2 kg)  11/13/17 127 lb 5 oz (57.7 kg)    Physical Exam  Results for orders placed or performed in visit on 01/13/18  HM PAP SMEAR  Result Value Ref Range   HM Pap smear negative with negative HPV       Assessment & Plan:   Problem List Items Addressed This Visit    None       Follow up plan: No follow-ups on file.

## 2018-08-10 ENCOUNTER — Other Ambulatory Visit: Payer: Self-pay

## 2018-08-10 ENCOUNTER — Encounter: Payer: Self-pay | Admitting: Family Medicine

## 2018-08-10 ENCOUNTER — Ambulatory Visit (INDEPENDENT_AMBULATORY_CARE_PROVIDER_SITE_OTHER): Payer: Managed Care, Other (non HMO) | Admitting: Family Medicine

## 2018-08-10 ENCOUNTER — Ambulatory Visit: Payer: Managed Care, Other (non HMO) | Admitting: Family Medicine

## 2018-08-10 VITALS — Wt 135.0 lb

## 2018-08-10 DIAGNOSIS — J329 Chronic sinusitis, unspecified: Secondary | ICD-10-CM

## 2018-08-10 DIAGNOSIS — F3340 Major depressive disorder, recurrent, in remission, unspecified: Secondary | ICD-10-CM | POA: Diagnosis not present

## 2018-08-10 DIAGNOSIS — F1021 Alcohol dependence, in remission: Secondary | ICD-10-CM

## 2018-08-10 DIAGNOSIS — J301 Allergic rhinitis due to pollen: Secondary | ICD-10-CM

## 2018-08-10 DIAGNOSIS — F4010 Social phobia, unspecified: Secondary | ICD-10-CM

## 2018-08-10 MED ORDER — AMOXICILLIN-POT CLAVULANATE 875-125 MG PO TABS: 1.0000 | ORAL_TABLET | Freq: Two times a day (BID) | ORAL | 0 refills | Status: DC

## 2018-08-10 MED ORDER — CITALOPRAM HYDROBROMIDE 40 MG PO TABS: 40.0000 mg | ORAL_TABLET | Freq: Every day | ORAL | 3 refills | Status: DC

## 2018-08-10 MED ORDER — BUSPIRONE HCL 5 MG PO TABS: 5.0000 mg | ORAL_TABLET | Freq: Three times a day (TID) | ORAL | 1 refills | Status: DC

## 2018-08-10 MED ORDER — FLUTICASONE PROPIONATE 50 MCG/ACT NA SUSP: 2.0000 | Freq: Two times a day (BID) | NASAL | 6 refills | Status: DC

## 2018-08-10 MED ORDER — PREDNISONE 50 MG PO TABS: 50.0000 mg | ORAL_TABLET | Freq: Every day | ORAL | 0 refills | Status: DC

## 2018-08-10 MED ORDER — CETIRIZINE HCL 10 MG PO TABS: 10.0000 mg | ORAL_TABLET | Freq: Every day | ORAL | 11 refills | Status: DC

## 2018-08-10 MED ORDER — MONTELUKAST SODIUM 10 MG PO TABS: 10.0000 mg | ORAL_TABLET | Freq: Every day | ORAL | 3 refills | Status: DC

## 2018-08-10 NOTE — Progress Notes (Signed)
Wt 135 lb (61.2 kg) Comment: Patient reported  BMI 24.30 kg/m    Subjective:    Patient ID: Sandra Wright, female    DOB: March 29, 1976, 43 y.o.   MRN: [id number]  HPI: Sandra Wright is a 43 y.o. female  Chief Complaint  Patient presents with  . Anxiety  . URI    sinus pressure headache behind eyes and cough, she finished her antibiotic but feels like she still has it  . Telemedicine visit   DEPRESSION Mood status: controlled Satisfied with current treatment?: yes Symptom severity: mild  Duration of current treatment : chronic Side effects: no Medication compliance: excellent compliance Psychotherapy/counseling: no  Previous psychiatric medications: citalopram, buspar Depressed mood: no Anxious mood: no Anhedonia: no Significant weight loss or gain: no Insomnia: no  Fatigue: no Feelings of worthlessness or guilt: no Impaired concentration/indecisiveness: no Suicidal ideations: no Hopelessness: no Crying spells: no Depression screen [facility] 2/9 08/10/2018 10/16/2017 06/03/2017 03/16/2017 11/27/2016  Decreased Interest 0 0 0 0 0  Down, Depressed, Hopeless 0 0 0 0 0  PHQ - 2 Score 0 0 0 0 0  Altered sleeping 0 0 0 0 0  Tired, decreased energy 0 1 2 1  0  Change in appetite 0 0 0 0 0  Feeling bad or failure about yourself  0 0 0 0 0  Trouble concentrating 0 0 0 0 0  Moving slowly or fidgety/restless 0 0 0 1 0  Suicidal thoughts 0 0 0 0 0  PHQ-9 Score 0 1 2 2  0  Difficult doing work/chores Not difficult at all Not difficult at all - Somewhat difficult -   UPPER RESPIRATORY TRACT INFECTION- feeling a little bit better. But has not been doing great.   Duration: >2-3 weeks Worst symptom: Facial pressure Fever: no Cough: no Shortness of breath: no Wheezing: no Chest pain: no Chest tightness: no Chest congestion: no Nasal congestion: yes Runny nose: yes Post nasal drip: yes Sneezing: no Sore throat: no Swollen glands: no Sinus pressure: yes Headache: yes Face  pain: yes Toothache: no Ear pain: no  Ear pressure: no  Eyes red/itching:no Eye drainage/crusting: no  Vomiting: no Rash: no Fatigue: no Sick contacts: no Strep contacts: no  Context: stable Recurrent sinusitis: yes Relief with OTC cold/cough medications: no  Treatments attempted: cold/sinus, mucinex, anti-histamine and pseudoephedrine   Relevant past medical, surgical, family and social history reviewed and updated as indicated. Interim medical history since our last visit reviewed. Allergies and medications reviewed and updated.  Review of Systems  Constitutional: Negative.  Negative for activity change, appetite change, chills, diaphoresis, fatigue, fever and unexpected weight change.  HENT: Positive for congestion, postnasal drip, rhinorrhea, sinus pressure and sinus pain. Negative for dental problem, drooling, ear discharge, ear pain, facial swelling, hearing loss, mouth sores, nosebleeds, sneezing, sore throat, tinnitus, trouble swallowing and voice change.   Eyes: Negative.   Respiratory: Negative.   Cardiovascular: Negative.   Gastrointestinal: Negative.   Neurological: Negative.   Psychiatric/Behavioral: Negative.     Per HPI unless specifically indicated above     Objective:    Wt 135 lb (61.2 kg) Comment: Patient reported  BMI 24.30 kg/m   Wt Readings from Last 3 Encounters:  08/10/18 135 lb (61.2 kg)  07/13/18 138 lb (62.6 kg)  02/25/18 135 lb (61.2 kg)    Physical Exam Vitals signs and nursing note reviewed.  Constitutional:      General: She is not in acute distress.    Appearance:  Normal appearance. She is normal weight. She is not ill-appearing, toxic-appearing or diaphoretic.  HENT:     Head: Normocephalic and atraumatic.     Comments: Tenderness on self-palpation over R maxillary sinus    Right Ear: External ear normal.     Left Ear: External ear normal.     Nose: Nose normal.     Mouth/Throat:     Mouth: Mucous membranes are moist.      Pharynx: Oropharynx is clear. No oropharyngeal exudate or posterior oropharyngeal erythema.  Eyes:     General: No scleral icterus.       Right eye: No discharge.        Left eye: No discharge.     Extraocular Movements: Extraocular movements intact.     Conjunctiva/sclera: Conjunctivae normal.     Pupils: Pupils are equal, round, and reactive to light.  Neck:     Musculoskeletal: Normal range of motion.  Pulmonary:     Effort: Pulmonary effort is normal. No respiratory distress.     Comments: Speaking in full sentances Musculoskeletal: Normal range of motion.  Skin:    Coloration: Skin is not jaundiced or pale.     Findings: No bruising, erythema, lesion or rash.  Neurological:     Mental Status: She is alert and oriented to person, place, and time. Mental status is at baseline.     Cranial Nerves: No cranial nerve deficit.  Psychiatric:        Mood and Affect: Mood normal.        Behavior: Behavior normal.        Thought Content: Thought content normal.        Judgment: Judgment normal.     Results for orders placed or performed in visit on 01/13/18  HM PAP SMEAR  Result Value Ref Range   HM Pap smear negative with negative HPV       Assessment & Plan:   Problem List Items Addressed This Visit      Respiratory   Allergic rhinitis    Stable on medicine. Appears to have another sinus infection. Will continue medication for allergic rhinitis and get her into ENT.         Other   Depression, major, recurrent, in remission [facility])    Doing great. No concerns. Refills given today for 1 year. Return for physical.       Relevant Medications   citalopram (CELEXA) 40 MG tablet   busPIRone (BUSPAR) 5 MG tablet   Recovering alcoholic (HCC)    Doing great. No concerns. Refills given today for 1 year. Return for physical.      Social anxiety disorder    Doing great. No concerns. Refills given today for 1 year. Return for physical.      Relevant Medications   citalopram  (CELEXA) 40 MG tablet   busPIRone (BUSPAR) 5 MG tablet    Other Visit Diagnoses    Recurrent sinus infections    -  Primary   Has had 6 in the last 14 months. Will get her into ENT. Will treat and referral generated today. Call with any concerns.   Relevant Medications   predniSONE (DELTASONE) 50 MG tablet   fluticasone (FLONASE) 50 MCG/ACT nasal spray   cetirizine (ZYRTEC) 10 MG tablet   amoxicillin-clavulanate (AUGMENTIN) 875-125 MG tablet   Other Relevant Orders   Ambulatory referral to ENT       Follow up plan: No follow-ups on file.   . This visit was  completed via Skype due to the restrictions of the COVID-19 pandemic. All issues as above were discussed and addressed. Physical exam was done as above through visual confirmation on Skype. If it was felt that the patient should be evaluated in the office, they were directed there. The patient verbally consented to this visit. . Location of the patient: home . Location of the provider: work . Those involved with this call:  . Provider: Olevia Perches, DO . CMA: Sheilah Mins, CMA . Front Desk/Registration: Landis Martins  . Time spent on call: 10 minutes with patient face to face via video conference. More than 50% of this time was spent in counseling and coordination of care.

## 2018-08-10 NOTE — Assessment & Plan Note (Signed)
Doing great. No concerns. Refills given today for 1 year. Return for physical.  

## 2018-08-10 NOTE — Assessment & Plan Note (Signed)
Doing great. No concerns. Refills given today for 1 year. Return for physical.

## 2018-08-10 NOTE — Assessment & Plan Note (Signed)
Stable on medicine. Appears to have another sinus infection. Will continue medication for allergic rhinitis and get her into ENT.

## 2019-02-21 ENCOUNTER — Ambulatory Visit (INDEPENDENT_AMBULATORY_CARE_PROVIDER_SITE_OTHER): Payer: Managed Care, Other (non HMO) | Admitting: Family Medicine

## 2019-02-21 ENCOUNTER — Other Ambulatory Visit: Payer: Self-pay

## 2019-02-21 ENCOUNTER — Encounter: Payer: Self-pay | Admitting: Family Medicine

## 2019-02-21 VITALS — Ht 62.0 in | Wt 140.0 lb

## 2019-02-21 DIAGNOSIS — J329 Chronic sinusitis, unspecified: Secondary | ICD-10-CM | POA: Diagnosis not present

## 2019-02-21 MED ORDER — PREDNISONE 50 MG PO TABS: 50.0000 mg | ORAL_TABLET | Freq: Every day | ORAL | 0 refills | Status: DC

## 2019-02-21 MED ORDER — AMOXICILLIN-POT CLAVULANATE 875-125 MG PO TABS: 1.0000 | ORAL_TABLET | Freq: Two times a day (BID) | ORAL | 0 refills | Status: DC

## 2019-02-21 MED ORDER — MONTELUKAST SODIUM 10 MG PO TABS: 10.0000 mg | ORAL_TABLET | Freq: Every day | ORAL | 3 refills | Status: DC

## 2019-02-21 NOTE — Progress Notes (Signed)
Ht 5\' 2"  (1.575 m)   Wt 140 lb (63.5 kg)   BMI 25.61 kg/m    Subjective:    Patient ID: Sandra Wright , female    DOB: [birth year], 43 y.o.   MRN: [id number]  HPI: Sandra Wright [id number] is a 43 y.o. female  Chief Complaint  Patient presents with  . Headache    head pressure x a week ago. tried OTC sudafed   . Nasal Congestion   UPPER RESPIRATORY TRACT INFECTION Duration: 1 week Worst symptom: pressure, headache Fever: no Cough: no Shortness of breath: no Wheezing: no Chest pain: no Chest tightness: no Chest congestion: no Nasal congestion: yes Runny nose: yes Post nasal drip: yes Sneezing: yes Sore throat: no Swollen glands: no Sinus pressure: yes Headache: yes Face pain: yes Toothache: no Ear pain: no  Ear pressure: no  Eyes red/itching:yes Eye drainage/crusting: no  Vomiting: no Rash: no Fatigue: yes Sick contacts: no Strep contacts: no  Context: worse Recurrent sinusitis: yes Relief with OTC cold/cough medications: no  Treatments attempted: flonase, zyrtec, sudafed   Relevant past medical, surgical, family and social history reviewed and updated as indicated. Interim medical history since our last visit reviewed. Allergies and medications reviewed and updated.  Review of Systems  Constitutional: Positive for fatigue. Negative for activity change, appetite change, chills, diaphoresis, fever and unexpected weight change.  HENT: Positive for congestion, postnasal drip, rhinorrhea, sinus pressure, sinus pain and sneezing. Negative for dental problem, drooling, ear discharge, ear pain, facial swelling, hearing loss, mouth sores, nosebleeds, sore throat, tinnitus, trouble swallowing and voice change.   Respiratory: Negative.   Cardiovascular: Negative.   Gastrointestinal: Negative.   Psychiatric/Behavioral: Negative.     Per HPI unless specifically indicated above     Objective:    Ht 5\' 2"  (1.575 m)   Wt 140 lb (63.5 kg)   BMI 25.61 kg/m   Wt Readings  from Last 3 Encounters:  02/21/19 140 lb (63.5 kg)  08/10/18 135 lb (61.2 kg)  07/13/18 138 lb (62.6 kg)    Physical Exam Vitals signs and nursing note reviewed.  Constitutional:      General: She is not in acute distress.    Appearance: Normal appearance. She is ill-appearing. She is not toxic-appearing or diaphoretic.  HENT:     Head: Normocephalic and atraumatic.     Right Ear: External ear normal.     Left Ear: External ear normal.     Nose:     Right Turbinates: Enlarged and swollen.     Left Turbinates: Enlarged and swollen.     Right Sinus: Maxillary sinus tenderness present.     Left Sinus: Maxillary sinus tenderness present.     Mouth/Throat:     Mouth: Mucous membranes are moist.     Pharynx: Oropharynx is clear.  Eyes:     General: No scleral icterus.       Right eye: No discharge.        Left eye: No discharge.     Conjunctiva/sclera: Conjunctivae normal.     Pupils: Pupils are equal, round, and reactive to light.  Neck:     Musculoskeletal: Normal range of motion.  Pulmonary:     Effort: Pulmonary effort is normal. No respiratory distress.     Comments: Speaking in full sentences Musculoskeletal: Normal range of motion.  Skin:    Coloration: Skin is not jaundiced or pale.     Findings: No bruising, erythema, lesion or rash.  Neurological:  Mental Status: She is alert and oriented to person, place, and time. Mental status is at baseline.  Psychiatric:        Mood and Affect: Mood normal.        Behavior: Behavior normal.        Thought Content: Thought content normal.        Judgment: Judgment normal.     Results for orders placed or performed in visit on 01/13/18  HM PAP SMEAR  Result Value Ref Range   HM Pap smear negative with negative HPV       Assessment & Plan:   Problem List Items Addressed This Visit    None    Visit Diagnoses    Recurrent sinus infections    -  Primary   Will treat with prednisone- if not better in 48 hours start  agumentin. Discussed starting singulair around change in weather to avoid sinusitis. Call if worse.    Relevant Medications   predniSONE (DELTASONE) 50 MG tablet   amoxicillin-clavulanate (AUGMENTIN) 875-125 MG tablet       Follow up plan: Return if symptoms worsen or fail to improve.    . This visit was completed via Doximity due to the restrictions of the COVID-19 pandemic. All issues as above were discussed and addressed. Physical exam was done as above through visual confirmation on Doximity. If it was felt that the patient should be evaluated in the office, they were directed there. The patient verbally consented to this visit. . Location of the patient: home . Location of the provider: work . Those involved with this call:  . Provider: Park Liter, DO . CMA: Lesle Chris, [place] . Front Desk/Registration: Don Perking  . Time spent on call: 15 minutes with patient face to face via video conference. More than 50% of this time was spent in counseling and coordination of care. 23 minutes total spent in review of patient's record and preparation of their chart.

## 2019-03-14 ENCOUNTER — Telehealth: Payer: Self-pay | Admitting: Family Medicine

## 2019-03-14 DIAGNOSIS — J329 Chronic sinusitis, unspecified: Secondary | ICD-10-CM

## 2019-03-14 NOTE — Telephone Encounter (Signed)
I've referred her to ENT. They should be calling

## 2019-03-14 NOTE — Telephone Encounter (Signed)
Patient notified

## 2019-03-14 NOTE — Telephone Encounter (Signed)
Copied from [place] [id number]. Topic: General - Other >> Mar 14, 2019  9:55 AM Pauline Good wrote: Reason for CRM: pt called and stated her sinus infection hasn't gotten no better and need to know what does she need to do

## 2019-04-22 ENCOUNTER — Other Ambulatory Visit: Payer: Self-pay | Admitting: Family Medicine

## 2019-04-22 MED ORDER — FLUTICASONE PROPIONATE 50 MCG/ACT NA SUSP: 2.0000 | Freq: Two times a day (BID) | NASAL | 0 refills | Status: DC

## 2019-04-22 NOTE — Telephone Encounter (Signed)
Pt request refill  fluticasone (FLONASE) 50 MCG/ACT nasal spray  albuterol (PROVENTIL HFA) 108 (90 BASE) MCG/ACT inhaler  (last prescribed by Dr Sanda Klein)  Advised pt she may need appt for the inhaler.  Pt wanted me to ask for it, ans she will make appt if need.  [facility] [address] [place] [phone] (Phone) [phone] (Fax)

## 2019-05-09 ENCOUNTER — Other Ambulatory Visit: Payer: Self-pay

## 2019-05-09 ENCOUNTER — Encounter: Payer: Self-pay | Admitting: Emergency Medicine

## 2019-05-09 ENCOUNTER — Ambulatory Visit
Admission: EM | Admit: 2019-05-09 | Discharge: 2019-05-09 | Disposition: A | Discharge status: Home / Self Care | Payer: Managed Care, Other (non HMO) | Attending: Family Medicine | Admitting: Family Medicine

## 2019-05-09 DIAGNOSIS — W268XXA Contact with other sharp object(s), not elsewhere classified, initial encounter: Secondary | ICD-10-CM | POA: Diagnosis not present

## 2019-05-09 DIAGNOSIS — Z23 Encounter for immunization: Secondary | ICD-10-CM | POA: Diagnosis not present

## 2019-05-09 DIAGNOSIS — S61411A Laceration without foreign body of right hand, initial encounter: Secondary | ICD-10-CM | POA: Diagnosis not present

## 2019-05-09 MED ORDER — TETANUS-DIPHTH-ACELL PERTUSSIS 5-2.5-18.5 LF-MCG/0.5 IM SUSP
0.5000 mL | Freq: Once | INTRAMUSCULAR | Status: AC
  Administered 2019-05-09: 09:00:00 0.5 mL via INTRAMUSCULAR

## 2019-05-09 NOTE — ED Triage Notes (Signed)
Pt has a laceration on her right hand between her pinky and ring finger. Occurred yesterday about 10:30. She has a closure band aid applied. No active bleeding. Unknown tetanus vaccine.

## 2019-05-09 NOTE — ED Provider Notes (Addendum)
[facility]    CSN: [id number] Arrival date & time: 05/09/19  5035   History   Chief Complaint Chief Complaint  Patient presents with  . Laceration    right hand    HPI  43 year old female presents with a laceration to her right hand.  Patient reports that she has suffered a laceration to her right hand in between the fourth and fifth digits at approximately 1030 yesterday morning.  She states that she was accidentally cut by a piece of sheet metal.  No bleeding.  She is unsure of her last tetanus.  Denies pain at this time.  She has 2 butterfly dressings on the wound which resulted in good approximation.  No other reported symptoms.  No other complaints or concerns at this time.  PMH, Surgical Hx, Family Hx, Social History reviewed and updated as below.  Past Medical History:  Diagnosis Date  . Degenerative cervical disc   . Endometriosis    Patient Active Problem List   Diagnosis Date Noted  . Recovering alcoholic (HCC) 09/18/2016  . Social anxiety disorder 09/18/2016  . Depression, major, recurrent, in remission (HCC) 02/18/2016  . Deviated nasal septum 03/06/2015  . Eosinophilia 03/06/2015  . Allergic rhinitis 03/06/2015  . Allergic conjunctivitis 03/06/2015  . Degenerative cervical disc     Past Surgical History:  Procedure Laterality Date  . AUGMENTATION MAMMAPLASTY  2001  . BREAST ENHANCEMENT SURGERY    . RIGHT OOPHORECTOMY Right     OB History   No obstetric history on file.      Home Medications    Prior to Admission medications   Medication Sig Start Date End Date Taking? Authorizing Provider  busPIRone (BUSPAR) 5 MG tablet Take 1-2 tablets (5-10 mg total) by mouth 3 (three) times daily. 08/10/18  Yes Johnson, Megan P, DO  citalopram (CELEXA) 40 MG tablet Take 1 tablet (40 mg total) by mouth daily. 08/10/18  Yes Johnson, Megan P, DO  fluticasone (FLONASE) 50 MCG/ACT nasal spray Place 2 sprays into both nostrils 2 (two) times daily.  04/22/19  Yes Johnson, Megan P, DO  levonorgestrel-ethinyl estradiol (SEASONALE,INTROVALE,JOLESSA) 0.15-0.03 MG tablet Take 1 tablet by mouth daily.   Yes [provider]  montelukast (SINGULAIR) 10 MG tablet Take 1 tablet (10 mg total) by mouth at bedtime. Take around the change in season to prevent sinus infections 02/21/19  Yes Dorcas Carrow, DO    Family History Family History  Problem Relation Age of Onset  . Arthritis Mother   . Diabetes Mother   . Hypertension Mother   . Cancer Neg Hx   . COPD Neg Hx   . Heart disease Neg Hx   . Stroke Neg Hx   . Breast cancer Neg Hx     Social History Social History   Tobacco Use  . Smoking status: Former Smoker    Packs/day: 1.00    Years: 15.00    Pack years: 15.00    Types: Cigarettes    Quit date: 05/19/2002    Years since quitting: 16.9  . Smokeless tobacco: Never Used  Substance Use Topics  . Alcohol use: Yes    Comment: occasionally  . Drug use: No     Allergies   Percocet [oxycodone-acetaminophen]   Review of Systems Review of Systems  Constitutional: Negative.   Skin: Positive for wound.   Physical Exam Triage Vital Signs ED Triage Vitals  Enc Vitals Group     BP 05/09/19 0854 128/71  Pulse Rate 05/09/19 0854 63     Resp 05/09/19 0854 18     Temp 05/09/19 0854 98.2 F (36.8 C)     Temp Source 05/09/19 0854 Oral     SpO2 05/09/19 0854 100 %     Weight 05/09/19 0850 150 lb (68 kg)     Height 05/09/19 0850 5\' 3"  (1.6 m)     Head Circumference --      Peak Flow --      Pain Score 05/09/19 0850 0     Pain Loc --      Pain Edu? --      Excl. in [place]? --    Updated Vital Signs BP 128/71 (BP Location: Left Arm)   Pulse 63   Temp 98.2 F (36.8 C) (Oral)   Resp 18   Ht 5\' 3"  (1.6 m)   Wt 68 kg   SpO2 100%   BMI 26.57 kg/m   Visual Acuity Right Eye Distance:   Left Eye Distance:   Bilateral Distance:    Right Eye Near:   Left Eye Near:    Bilateral Near:     Physical Exam Vitals  and nursing note reviewed.  Constitutional:      General: She is not in acute distress.    Appearance: Normal appearance. She is not ill-appearing.  HENT:     Head: Normocephalic and atraumatic.  Eyes:     General:        Right eye: No discharge.        Left eye: No discharge.     Conjunctiva/sclera: Conjunctivae normal.  Cardiovascular:     Rate and Rhythm: Normal rate and regular rhythm.     Heart sounds: No murmur.  Pulmonary:     Effort: Pulmonary effort is normal.     Breath sounds: Normal breath sounds.  Skin:    Comments: Right hand -approximately 2.5 cm laceration at the interdigital space between the fourth and fifth digits.  Well approximated.  No bleeding.  No surrounding erythema.  No purulent discharge.  Neurological:     Mental Status: She is alert.  Psychiatric:        Mood and Affect: Mood normal.        Behavior: Behavior normal.    UC Treatments / Results  Labs (all labs ordered are listed, but only abnormal results are displayed) Labs Reviewed - No data to display  EKG   Radiology No results found.  Procedures Laceration Repair  Date/Time: 05/09/2019 9:21 AM Performed by: Coral Spikes, DO Authorized by: Coral Spikes, DO   Consent:    Consent obtained:  Verbal   Consent given by:  Patient Anesthesia (see MAR for exact dosages):    Anesthesia method:  None Laceration details:    Location:  Hand   Hand location:  R hand, dorsum   Length (cm):  2.5 Repair type:    Repair type:  Simple Treatment:    Wound cleansed with: Alcohol. Skin repair:    Repair method:  Tissue adhesive Approximation:    Approximation:  Close Post-procedure details:    Dressing:  Open (no dressing)   (including critical care time)  Medications Ordered in UC Medications  Tdap (BOOSTRIX) injection 0.5 mL (0.5 mLs Intramuscular Given 05/09/19 0900)    Initial Impression / Assessment and Plan / UC Course  I have reviewed the triage vital signs and the nursing  notes.  Pertinent labs & imaging results that were available during  my care of the patient were reviewed by me and considered in my medical decision making (see chart for details).    43 year old female presents with a laceration to her right hand.  Closed with Dermabond.  Tetanus given. Supportive care.  Final Clinical Impressions(s) / UC Diagnoses   Final diagnoses:  Laceration of right hand without foreign body, initial encounter   Discharge Instructions   None    ED Prescriptions    None     PDMP not reviewed this encounter.      Everlene OtherCook, Violette Morneault [place], [place] 05/09/19 [phone]

## 2019-08-16 ENCOUNTER — Other Ambulatory Visit: Payer: Self-pay

## 2019-08-16 ENCOUNTER — Ambulatory Visit: Payer: Managed Care, Other (non HMO) | Admitting: Family Medicine

## 2019-08-16 ENCOUNTER — Encounter: Payer: Self-pay | Admitting: Family Medicine

## 2019-08-16 VITALS — BP 123/81 | HR 84 | Temp 98.2°F

## 2019-08-16 DIAGNOSIS — R21 Rash and other nonspecific skin eruption: Secondary | ICD-10-CM | POA: Diagnosis not present

## 2019-08-16 MED ORDER — MUPIROCIN 2 % EX OINT: 1.0000 "application " | TOPICAL_OINTMENT | Freq: Every day | CUTANEOUS | 0 refills | Status: DC

## 2019-08-16 MED ORDER — HYDROCORTISONE 0.5 % EX OINT: 1.0000 "application " | TOPICAL_OINTMENT | Freq: Two times a day (BID) | CUTANEOUS | 0 refills | Status: DC

## 2019-08-16 NOTE — Progress Notes (Signed)
BP 123/81 (BP Location: Left Arm, Patient Position: Sitting, Cuff Size: Normal)   Pulse 84   Temp 98.2 F (36.8 C) (Oral)   SpO2 95%    Subjective:    Patient ID: Sandra Wright, female    DOB: November 25, 1975, 44 y.o.   MRN: [id number]  HPI: Sandra Wright is a 44 y.o. female  Chief Complaint  Patient presents with  . Rash    Under both eyes-started 3wks ago under R eye    RASH Duration:  3 weeks  Location: face  Itching: no Burning: no Redness: yes Oozing: no Scaling: no Blisters: no Painful: no Fevers: no Change in detergents/soaps/personal care products: no Recent illness: no Recent travel:no History of same: no Context: stable Alleviating factors: nothing Treatments attempted:nothing Shortness of breath: no  Throat/tongue swelling: no Myalgias/arthralgias: no  Relevant past medical, surgical, family and social history reviewed and updated as indicated. Interim medical history since our last visit reviewed. Allergies and medications reviewed and updated.  Review of Systems  Per HPI unless specifically indicated above     Objective:    BP 123/81 (BP Location: Left Arm, Patient Position: Sitting, Cuff Size: Normal)   Pulse 84   Temp 98.2 F (36.8 C) (Oral)   SpO2 95%   Wt Readings from Last 3 Encounters:  05/09/19 150 lb (68 kg)  02/21/19 140 lb (63.5 kg)  08/10/18 135 lb (61.2 kg)    Physical Exam Vitals and nursing note reviewed.  Constitutional:      General: She is not in acute distress.    Appearance: Normal appearance. She is not ill-appearing, toxic-appearing or diaphoretic.  HENT:     Head: Normocephalic and atraumatic.     Right Ear: External ear normal.     Left Ear: External ear normal.     Nose: Nose normal.     Mouth/Throat:     Mouth: Mucous membranes are moist.     Pharynx: Oropharynx is clear.  Eyes:     General: No scleral icterus.       Right eye: No discharge.        Left eye: No discharge.     Extraocular Movements:  Extraocular movements intact.     Conjunctiva/sclera: Conjunctivae normal.     Pupils: Pupils are equal, round, and reactive to light.  Cardiovascular:     Rate and Rhythm: Normal rate and regular rhythm.     Pulses: Normal pulses.     Heart sounds: Normal heart sounds. No murmur. No friction rub. No gallop.   Pulmonary:     Effort: Pulmonary effort is normal. No respiratory distress.     Breath sounds: Normal breath sounds. No stridor. No wheezing, rhonchi or rales.  Chest:     Chest wall: No tenderness.  Musculoskeletal:        General: Normal range of motion.     Cervical back: Normal range of motion and neck supple.  Skin:    General: Skin is warm and dry.     Capillary Refill: Capillary refill takes less than 2 seconds.     Coloration: Skin is not jaundiced or pale.     Findings: Rash (papular red rash on L side of case where she wears her mask) present. No bruising, erythema or lesion.  Neurological:     General: No focal deficit present.     Mental Status: She is alert and oriented to person, place, and time. Mental status is at baseline.  Psychiatric:  Mood and Affect: Mood normal.        Behavior: Behavior normal.        Thought Content: Thought content normal.        Judgment: Judgment normal.     Results for orders placed or performed in visit on 01/13/18  HM PAP SMEAR  Result Value Ref Range   HM Pap smear negative with negative HPV       Assessment & Plan:   Problem List Items Addressed This Visit    None    Visit Diagnoses    Rash    -  Primary   Unclear if folliculitis or allergic dermatitis. Will treat with bactroban and hydrocortisone. Call if not getting better or getting worse. Continue to monitor.        Follow up plan: Return if symptoms worsen or fail to improve, for physical tihs year.

## 2019-08-22 ENCOUNTER — Telehealth: Payer: Self-pay | Admitting: Family Medicine

## 2019-08-22 NOTE — Telephone Encounter (Signed)
Pt called stating that the cream she was prescribed for the rash under her eyes helped some the first two days, but now it is not helping. Pt is requesting an oral medication.  Please advise.     [facility] [address] [place] - [address]  [address] [place] [address]  Phone: [phone] Fax: [phone]  Not a 24 hour pharmacy; exact hours not known.

## 2019-08-24 MED ORDER — PREDNISONE 50 MG PO TABS: 50.0000 mg | ORAL_TABLET | Freq: Every day | ORAL | 0 refills | Status: DC

## 2019-09-06 ENCOUNTER — Other Ambulatory Visit: Payer: Self-pay | Admitting: Family Medicine

## 2019-09-06 ENCOUNTER — Ambulatory Visit: Payer: Self-pay | Admitting: *Deleted

## 2019-09-06 NOTE — Telephone Encounter (Signed)
Pt has apt on 09/07/2019

## 2019-09-06 NOTE — Telephone Encounter (Signed)
Requested medication (s) are due for refill today: yes  Requested medication (s) are on the active medication list: yes  Last refill:  06/01/2019  Future visit scheduled: yes  Notes to clinic:  patient has appointment tomorrow    Requested Prescriptions  Pending Prescriptions Disp Refills   citalopram (CELEXA) 40 MG tablet [Pharmacy Med Name: Citalopram Hydrobromide 40 MG Oral Tablet] 90 tablet 0    Sig: Take 1 tablet by mouth once daily      Psychiatry:  Antidepressants - SSRI Failed - 09/06/2019 12:53 PM      Failed - Completed PHQ-2 or PHQ-9 in the last 360 days.      Passed - Valid encounter within last 6 months    Recent Outpatient Visits           3 weeks ago Rash   [facility] [place], [place], DO   6 months ago Recurrent sinus infections   [facility] [place], [place], DO   1 year ago Recurrent sinus infections   [facility] [place], [place], DO   1 year ago Acute maxillary sinusitis, recurrence not specified   [facility] [place], Megan P, DO   1 year ago Acute maxillary sinusitis, recurrence not specified   [facility] [place] Dorcas [place], DO       Future Appointments             In 2 months Laural Benes, Oralia Rud, DO [facility]

## 2019-09-06 NOTE — Telephone Encounter (Signed)
Per initial encounter, "Pt was seen 3/30 for a eye rash the steroid cream and and prednisone worked but after completing medication the rash has come back and redness but irration"; the rash on the bottom both eyes in the corners; she is having some itching but no pain or tearing; she completed her meds on 09/02/19, and the rash under her eyes returned on 09/04/19; her vision is not affected; recommendations made per nurse triage protocol; she would like a virtual appt; pt offered and accepted virtual appt with Dr Olevia Perches, [facility], 09/07/19 at 0815; she verbalized understanding; will route to office for notification. .Reason for Disposition . [1] Looks infected (spreading redness, pus) AND [2] no fever  Answer Assessment - Initial Assessment Questions 1. APPEARANCE of RASH: "Describe the rash."      red 2. LOCATION: "Where is the rash located?"      Bottom of both eyes in the corners 3. NUMBER: "How many spots are there?"       4. SIZE: "How big are the spots?" (Inches, centimeters or compare to size of a coin)       5. ONSET: "When did the rash start?"     09/04/19 6. ITCHING: "Does the rash itch?" If so, ask: "How bad is the itch?"  (Scale 1-10; or mild, moderate, severe)   no 7. PAIN: "Does the rash hurt?" If so, ask: "How bad is the pain?"  (Scale 1-10; or mild, moderate, severe)     no 8. OTHER SYMPTOMS: "Do you have any other symptoms?" (e.g., fever)     no 9. PREGNANCY: "Is there any chance you are pregnant?" "When was your last menstrual period?"   no  Protocols used: RASH OR REDNESS - LOCALIZED-A-AH

## 2019-09-06 NOTE — Telephone Encounter (Signed)
Needs appt

## 2019-09-06 NOTE — Telephone Encounter (Signed)
LOV 08/16/19 

## 2019-09-07 ENCOUNTER — Encounter: Payer: Self-pay | Admitting: Family Medicine

## 2019-09-07 ENCOUNTER — Telehealth (INDEPENDENT_AMBULATORY_CARE_PROVIDER_SITE_OTHER): Payer: Managed Care, Other (non HMO) | Admitting: Family Medicine

## 2019-09-07 DIAGNOSIS — H01135 Eczematous dermatitis of left lower eyelid: Secondary | ICD-10-CM

## 2019-09-07 DIAGNOSIS — H01132 Eczematous dermatitis of right lower eyelid: Secondary | ICD-10-CM | POA: Diagnosis not present

## 2019-09-07 MED ORDER — EUCRISA 2 % EX OINT: 1.0000 "application " | TOPICAL_OINTMENT | Freq: Two times a day (BID) | CUTANEOUS | 3 refills | Status: DC

## 2019-09-07 NOTE — Progress Notes (Signed)
There were no vitals taken for this visit.   Subjective:    Patient ID: Sandra Wright, female    DOB: March 11, 1976, 44 y.o.   MRN: [id number]  HPI: Sandra Wright is a 44 y.o. female  Chief Complaint  Patient presents with  . Rash    pt states she still has the rash on her face. States the prednisone helped but then it came back    RASH Duration:  About 2-3 weeks  Location: face- now under eyes  Itching: no Burning: yes Redness: yes Oozing: no Scaling: yes Blisters: no Painful: yes Fevers: no Change in detergents/soaps/personal care products: no Recent illness: no Recent travel:no History of same: no Context: fluctuating Alleviating factors: prednisone and hydrocortisone cream Treatments attempted:prednisone and hydrocortisone cream Shortness of breath: no  Throat/tongue swelling: no Myalgias/arthralgias: no  Relevant past medical, surgical, family and social history reviewed and updated as indicated. Interim medical history since our last visit reviewed. Allergies and medications reviewed and updated.  Review of Systems  Constitutional: Negative.   Respiratory: Negative.   Cardiovascular: Negative.   Gastrointestinal: Negative.   Musculoskeletal: Negative.   Skin: Positive for rash. Negative for color change, pallor and wound.  Psychiatric/Behavioral: Negative.     Per HPI unless specifically indicated above     Objective:    There were no vitals taken for this visit.  Wt Readings from Last 3 Encounters:  05/09/19 150 lb (68 kg)  02/21/19 140 lb (63.5 kg)  08/10/18 135 lb (61.2 kg)    Physical Exam Vitals and nursing note reviewed.  Constitutional:      General: She is not in acute distress.    Appearance: Normal appearance. She is not ill-appearing, toxic-appearing or diaphoretic.  HENT:     Head: Normocephalic and atraumatic.     Right Ear: External ear normal.     Left Ear: External ear normal.     Nose: Nose normal.     Mouth/Throat:   Mouth: Mucous membranes are moist.     Pharynx: Oropharynx is clear.  Eyes:     General: No scleral icterus.       Right eye: No discharge.        Left eye: No discharge.     Conjunctiva/sclera: Conjunctivae normal.     Pupils: Pupils are equal, round, and reactive to light.  Pulmonary:     Effort: Pulmonary effort is normal. No respiratory distress.     Comments: Speaking in full sentences Musculoskeletal:        General: Normal range of motion.     Cervical back: Normal range of motion.  Skin:    Coloration: Skin is not jaundiced or pale.     Findings: Erythema (erythematous patches under her eyes bilaterally) present. No bruising, lesion or rash.  Neurological:     Mental Status: She is alert and oriented to person, place, and time. Mental status is at baseline.  Psychiatric:        Mood and Affect: Mood normal.        Behavior: Behavior normal.        Thought Content: Thought content normal.        Judgment: Judgment normal.     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        Follow up plan: Return if symptoms worsen or fail to improve.   . This visit was completed via MyChart due to the restrictions of the COVID-19 pandemic. All issues as above were discussed and addressed. Physical exam was done as above through visual confirmation on MyChart. If it was felt that the patient should be evaluated in the office, they were directed there. The patient verbally consented to this visit. . Location of the patient: home . Location of the provider: home . Those involved with this call:  . Provider: Olevia Perches,  DO . CMA: Wilhemena Durie, CMA . Front Desk/Registration: Adela Ports  . Time spent on call: 15 minutes with patient face to face via video conference. More than 50% of this time was spent in counseling and coordination of care. 23 minutes total spent in review of patient's record and preparation of their chart.

## 2019-09-14 ENCOUNTER — Telehealth: Payer: Self-pay | Admitting: Family Medicine

## 2019-09-14 NOTE — Telephone Encounter (Signed)
Appointment scheduled.

## 2019-09-14 NOTE — Telephone Encounter (Signed)
Copied from CRM [id number]. Topic: General - Other >> Sep 14, 2019  9:17 AM Tamela Oddi wrote: Reason for CRM: Patient called to inform the nurse or doctor that she thinks she has a swollen lymph in her armpit.  She's not sure if she should come in or if the doctor can send something to the pharmacy for her.  Please call patient to discuss further at [phone]

## 2019-09-15 ENCOUNTER — Ambulatory Visit (INDEPENDENT_AMBULATORY_CARE_PROVIDER_SITE_OTHER): Payer: Managed Care, Other (non HMO) | Admitting: Family Medicine

## 2019-09-15 ENCOUNTER — Other Ambulatory Visit: Payer: Self-pay

## 2019-09-15 ENCOUNTER — Encounter: Payer: Self-pay | Admitting: Family Medicine

## 2019-09-15 VITALS — BP 112/72 | HR 75 | Temp 98.1°F | Ht 62.8 in | Wt 157.8 lb

## 2019-09-15 DIAGNOSIS — A6000 Herpesviral infection of urogenital system, unspecified: Secondary | ICD-10-CM

## 2019-09-15 DIAGNOSIS — R599 Enlarged lymph nodes, unspecified: Secondary | ICD-10-CM

## 2019-09-15 MED ORDER — VALACYCLOVIR HCL 1 G PO TABS: 1000.0000 mg | ORAL_TABLET | Freq: Two times a day (BID) | ORAL | 0 refills | Status: DC

## 2019-09-15 MED ORDER — VALACYCLOVIR HCL 1 G PO TABS: 1000.0000 mg | ORAL_TABLET | Freq: Every day | ORAL | 3 refills | Status: DC

## 2019-09-15 NOTE — Progress Notes (Signed)
BP 112/72 (BP Location: Left Arm, Patient Position: Sitting, Cuff Size: Normal)   Pulse 75   Temp 98.1 F (36.7 C) (Oral)   Ht 5' 2.8" (1.595 m)   Wt 157 lb 12.8 oz (71.6 kg)   SpO2 98%   BMI 28.14 kg/m    Subjective:    Patient ID: Sandra Wright, female    DOB: 02/03/1976, 44 y.o.   MRN: [id number]  HPI: Sandra Wright is a 44 y.o. female  Chief Complaint  Patient presents with  . Pain    right armpit, pt suspects swollen lymph node.    She notes that she has been having some pain and swelling of her lymph nodes under her R arm since she got her COVID shot. No fevers. No chills. She is otherwise feeling well.  She notes that she and her husband have been having outbreaks of a rash on their genitals for about 10 years. They have been very embarrassed about it, so have never mentioned it. She notes that they are very painful. She does not have an outbreak now, but generally has one about 1x a month. She is otherwise doing well with no other concerns or complaints at this time.   Relevant past medical, surgical, family and social history reviewed and updated as indicated. Interim medical history since our last visit reviewed. Allergies and medications reviewed and updated.  Review of Systems  Constitutional: Negative.   HENT: Negative.   Respiratory: Negative.   Cardiovascular: Negative.   Gastrointestinal: Negative.   Musculoskeletal: Negative.   Skin: Positive for rash. Negative for color change, pallor and wound.  Allergic/Immunologic: Negative.     Per HPI unless specifically indicated above     Objective:    BP 112/72 (BP Location: Left Arm, Patient Position: Sitting, Cuff Size: Normal)   Pulse 75   Temp 98.1 F (36.7 C) (Oral)   Ht 5' 2.8" (1.595 m)   Wt 157 lb 12.8 oz (71.6 kg)   SpO2 98%   BMI 28.14 kg/m   Wt Readings from Last 3 Encounters:  09/15/19 157 lb 12.8 oz (71.6 kg)  05/09/19 150 lb (68 kg)  02/21/19 140 lb (63.5 kg)    Physical  Exam Vitals and nursing note reviewed.  Constitutional:      General: She is not in acute distress.    Appearance: Normal appearance. She is not ill-appearing, toxic-appearing or diaphoretic.  HENT:     Head: Normocephalic and atraumatic.     Right Ear: External ear normal.     Left Ear: External ear normal.     Nose: Nose normal.     Mouth/Throat:     Mouth: Mucous membranes are moist.     Pharynx: Oropharynx is clear.  Eyes:     General: No scleral icterus.       Right eye: No discharge.        Left eye: No discharge.     Extraocular Movements: Extraocular movements intact.     Conjunctiva/sclera: Conjunctivae normal.     Pupils: Pupils are equal, round, and reactive to light.  Cardiovascular:     Rate and Rhythm: Normal rate and regular rhythm.     Pulses: Normal pulses.     Heart sounds: Normal heart sounds. No murmur. No friction rub. No gallop.   Pulmonary:     Effort: Pulmonary effort is normal. No respiratory distress.     Breath sounds: Normal breath sounds. No stridor. No wheezing, rhonchi or  rales.  Chest:     Chest wall: No tenderness.  Musculoskeletal:        General: Normal range of motion.     Cervical back: Normal range of motion and neck supple.  Skin:    General: Skin is warm and dry.     Capillary Refill: Capillary refill takes less than 2 seconds.     Coloration: Skin is not jaundiced or pale.     Findings: Rash (around both eyes ) present. No bruising, erythema or lesion.  Neurological:     General: No focal deficit present.     Mental Status: She is alert and oriented to person, place, and time. Mental status is at baseline.  Psychiatric:        Mood and Affect: Mood normal.        Behavior: Behavior normal.        Thought Content: Thought content normal.        Judgment: Judgment normal.     Results for orders placed or performed in visit on 09/15/19  CBC with Differential/Platelet  Result Value Ref Range   WBC 10.3 3.4 - 10.8 x10E3/uL   RBC  4.17 3.77 - 5.28 x10E6/uL   Hemoglobin 12.9 11.1 - 15.9 g/dL   Hematocrit 39.8 34.0 - 46.6 %   MCV 95 79 - 97 fL   MCH 30.9 26.6 - 33.0 pg   MCHC 32.4 31.5 - 35.7 g/dL   RDW 12.3 11.7 - 15.4 %   Platelets 365 150 - 450 x10E3/uL   Neutrophils 57 Not Estab. %   Lymphs 33 Not Estab. %   Monocytes 5 Not Estab. %   Eos 3 Not Estab. %   Basos 1 Not Estab. %   Neutrophils Absolute 6.0 1.4 - 7.0 x10E3/uL   Lymphocytes Absolute 3.4 (H) 0.7 - 3.1 x10E3/uL   Monocytes Absolute 0.6 0.1 - 0.9 x10E3/uL   EOS (ABSOLUTE) 0.3 0.0 - 0.4 x10E3/uL   Basophils Absolute 0.1 0.0 - 0.2 x10E3/uL   Immature Granulocytes 1 Not Estab. %   Immature Grans (Abs) 0.1 0.0 - 0.1 x10E3/uL      Assessment & Plan:   Problem List Items Addressed This Visit      Genitourinary   Genital herpes simplex    Will start valtrex for suppression. Call with any concerns. Continue to monitor.       Relevant Medications   valACYclovir (VALTREX) 1000 MG tablet   valACYclovir (VALTREX) 1000 MG tablet    Other Visit Diagnoses    Swollen lymph nodes    -  Primary   Likely due to her recent COVID shot. Will check CBC. Await results. Call with any concerns.    Relevant Orders   CBC with Differential/Platelet (Completed)       Follow up plan: Return if symptoms worsen or fail to improve.

## 2019-09-16 LAB — CBC WITH DIFFERENTIAL/PLATELET
Basophils Absolute: 0.1 10*3/uL (ref 0.0–0.2)
Basos: 1 %
EOS (ABSOLUTE): 0.3 10*3/uL (ref 0.0–0.4)
Eos: 3 %
Hematocrit: 39.8 % (ref 34.0–46.6)
Hemoglobin: 12.9 g/dL (ref 11.1–15.9)
Immature Grans (Abs): 0.1 10*3/uL (ref 0.0–0.1)
Immature Granulocytes: 1 %
Lymphocytes Absolute: 3.4 10*3/uL — ABNORMAL HIGH (ref 0.7–3.1)
Lymphs: 33 %
MCH: 30.9 pg (ref 26.6–33.0)
MCHC: 32.4 g/dL (ref 31.5–35.7)
MCV: 95 fL (ref 79–97)
Monocytes Absolute: 0.6 10*3/uL (ref 0.1–0.9)
Monocytes: 5 %
Neutrophils Absolute: 6 10*3/uL (ref 1.4–7.0)
Neutrophils: 57 %
Platelets: 365 10*3/uL (ref 150–450)
RBC: 4.17 x10E6/uL (ref 3.77–5.28)
RDW: 12.3 % (ref 11.7–15.4)
WBC: 10.3 10*3/uL (ref 3.4–10.8)

## 2019-09-18 ENCOUNTER — Encounter: Payer: Self-pay | Admitting: Family Medicine

## 2019-09-18 DIAGNOSIS — A6000 Herpesviral infection of urogenital system, unspecified: Secondary | ICD-10-CM | POA: Insufficient documentation

## 2019-09-18 NOTE — Assessment & Plan Note (Signed)
Will start valtrex for suppression. Call with any concerns. Continue to monitor.

## 2019-09-28 ENCOUNTER — Telehealth: Payer: Self-pay | Admitting: Family Medicine

## 2019-09-28 DIAGNOSIS — H01135 Eczematous dermatitis of left lower eyelid: Secondary | ICD-10-CM

## 2019-09-28 NOTE — Telephone Encounter (Signed)
Copied from CRM [id number]. Topic: Referral - Request for Referral >> Sep 28, 2019 10:21 AM Tamela Oddi wrote: Has patient seen PCP for this complaint? yes *If NO, is insurance requiring patient see PCP for this issue before PCP can refer them? Referral for which specialty:Dermatologist Preferred provider/office:  Doctor's recommendation Reason for referral: Rash under both eyes

## 2019-10-13 LAB — HM PAP SMEAR: HM Pap smear: NEGATIVE

## 2019-10-13 LAB — RESULTS CONSOLE HPV: CHL HPV: NEGATIVE

## 2019-11-29 ENCOUNTER — Ambulatory Visit (INDEPENDENT_AMBULATORY_CARE_PROVIDER_SITE_OTHER): Payer: Managed Care, Other (non HMO) | Admitting: Family Medicine

## 2019-11-29 ENCOUNTER — Other Ambulatory Visit: Payer: Self-pay

## 2019-11-29 ENCOUNTER — Encounter: Payer: Self-pay | Admitting: Family Medicine

## 2019-11-29 VITALS — BP 119/81 | HR 105 | Temp 97.9°F | Ht 62.4 in | Wt 154.8 lb

## 2019-11-29 DIAGNOSIS — F1021 Alcohol dependence, in remission: Secondary | ICD-10-CM

## 2019-11-29 DIAGNOSIS — Z0000 Encounter for general adult medical examination without abnormal findings: Secondary | ICD-10-CM | POA: Diagnosis not present

## 2019-11-29 DIAGNOSIS — F3340 Major depressive disorder, recurrent, in remission, unspecified: Secondary | ICD-10-CM

## 2019-11-29 DIAGNOSIS — Z1159 Encounter for screening for other viral diseases: Secondary | ICD-10-CM

## 2019-11-29 LAB — URINALYSIS, ROUTINE W REFLEX MICROSCOPIC
Bilirubin, UA: NEGATIVE
Glucose, UA: NEGATIVE
Ketones, UA: NEGATIVE
Nitrite, UA: NEGATIVE
Protein,UA: NEGATIVE
Specific Gravity, UA: 1.01 (ref 1.005–1.030)
Urobilinogen, Ur: 0.2 mg/dL (ref 0.2–1.0)
pH, UA: 6.5 (ref 5.0–7.5)

## 2019-11-29 LAB — MICROSCOPIC EXAMINATION

## 2019-11-29 MED ORDER — FLUTICASONE PROPIONATE 50 MCG/ACT NA SUSP: 2.0000 | Freq: Two times a day (BID) | NASAL | 1 refills | Status: DC

## 2019-11-29 MED ORDER — BUSPIRONE HCL 5 MG PO TABS: 5.0000 mg | ORAL_TABLET | Freq: Three times a day (TID) | ORAL | 1 refills | Status: DC

## 2019-11-29 MED ORDER — CITALOPRAM HYDROBROMIDE 40 MG PO TABS: 40.0000 mg | ORAL_TABLET | Freq: Every day | ORAL | 1 refills | Status: DC

## 2019-11-29 MED ORDER — MONTELUKAST SODIUM 10 MG PO TABS: 10.0000 mg | ORAL_TABLET | Freq: Every day | ORAL | 0 refills | Status: DC

## 2019-11-29 NOTE — Patient Instructions (Signed)
Health Maintenance, Female Adopting a healthy lifestyle and getting preventive care are important in promoting health and wellness. Ask your health care provider about:  The right schedule for you to have regular tests and exams.  Things you can do on your own to prevent diseases and keep yourself healthy. What should I know about diet, weight, and exercise? Eat a healthy diet   Eat a diet that includes plenty of vegetables, fruits, low-fat dairy products, and lean protein.  Do not eat a lot of foods that are high in solid fats, added sugars, or sodium. Maintain a healthy weight Body mass index (BMI) is used to identify weight problems. It estimates body fat based on height and weight. Your health care provider can help determine your BMI and help you achieve or maintain a healthy weight. Get regular exercise Get regular exercise. This is one of the most important things you can do for your health. Most adults should:  Exercise for at least 150 minutes each week. The exercise should increase your heart rate and make you sweat (moderate-intensity exercise).  Do strengthening exercises at least twice a week. This is in addition to the moderate-intensity exercise.  Spend less time sitting. Even light physical activity can be beneficial. Watch cholesterol and blood lipids Have your blood tested for lipids and cholesterol at 43 years of age, then have this test every 5 years. Have your cholesterol levels checked more often if:  Your lipid or cholesterol levels are high.  You are older than 43 years of age.  You are at high risk for heart disease. What should I know about cancer screening? Depending on your health history and family history, you may need to have cancer screening at various ages. This may include screening for:  Breast cancer.  Cervical cancer.  Colorectal cancer.  Skin cancer.  Lung cancer. What should I know about heart disease, diabetes, and high blood  pressure? Blood pressure and heart disease  High blood pressure causes heart disease and increases the risk of stroke. This is more likely to develop in people who have high blood pressure readings, are of African descent, or are overweight.  Have your blood pressure checked: ? Every 3-5 years if you are 18-39 years of age. ? Every year if you are 40 years old or older. Diabetes Have regular diabetes screenings. This checks your fasting blood sugar level. Have the screening done:  Once every three years after age 40 if you are at a normal weight and have a low risk for diabetes.  More often and at a younger age if you are overweight or have a high risk for diabetes. What should I know about preventing infection? Hepatitis B If you have a higher risk for hepatitis B, you should be screened for this virus. Talk with your health care provider to find out if you are at risk for hepatitis B infection. Hepatitis C Testing is recommended for:  Everyone born from 1945 through 1965.  Anyone with known risk factors for hepatitis C. Sexually transmitted infections (STIs)  Get screened for STIs, including gonorrhea and chlamydia, if: ? You are sexually active and are younger than 43 years of age. ? You are older than 43 years of age and your health care provider tells you that you are at risk for this type of infection. ? Your sexual activity has changed since you were last screened, and you are at increased risk for chlamydia or gonorrhea. Ask your health care provider if   you are at risk.  Ask your health care provider about whether you are at high risk for HIV. Your health care provider may recommend a prescription medicine to help prevent HIV infection. If you choose to take medicine to prevent HIV, you should first get tested for HIV. You should then be tested every 3 months for as long as you are taking the medicine. Pregnancy  If you are about to stop having your period (premenopausal) and  you may become pregnant, seek counseling before you get pregnant.  Take 400 to 800 micrograms (mcg) of folic acid every day if you become pregnant.  Ask for birth control (contraception) if you want to prevent pregnancy. Osteoporosis and menopause Osteoporosis is a disease in which the bones lose minerals and strength with aging. This can result in bone fractures. If you are 65 years old or older, or if you are at risk for osteoporosis and fractures, ask your health care provider if you should:  Be screened for bone loss.  Take a calcium or vitamin D supplement to lower your risk of fractures.  Be given hormone replacement therapy (HRT) to treat symptoms of menopause. Follow these instructions at home: Lifestyle  Do not use any products that contain nicotine or tobacco, such as cigarettes, e-cigarettes, and chewing tobacco. If you need help quitting, ask your health care provider.  Do not use street drugs.  Do not share needles.  Ask your health care provider for help if you need support or information about quitting drugs. Alcohol use  Do not drink alcohol if: ? Your health care provider tells you not to drink. ? You are pregnant, may be pregnant, or are planning to become pregnant.  If you drink alcohol: ? Limit how much you use to 0-1 drink a day. ? Limit intake if you are breastfeeding.  Be aware of how much alcohol is in your drink. In the [place] one drink equals one 12 oz bottle of beer (355 mL), one 5 oz glass of wine (148 mL), or one 1 oz glass of hard liquor (44 mL). General instructions  Schedule regular health, dental, and eye exams.  Stay current with your vaccines.  Tell your health care provider if: ? You often feel depressed. ? You have ever been abused or do not feel safe at home. Summary  Adopting a healthy lifestyle and getting preventive care are important in promoting health and wellness.  Follow your health care provider's instructions about healthy  diet, exercising, and getting tested or screened for diseases.  Follow your health care provider's instructions on monitoring your cholesterol and blood pressure. This information is not intended to replace advice given to you by your health care provider. Make sure you discuss any questions you have with your health care provider. Document Revised: 04/28/2018 Document Reviewed: 04/28/2018 Elsevier Patient Education  2020 [employer]  

## 2019-11-29 NOTE — Assessment & Plan Note (Signed)
Doing well.  No concerns.

## 2019-11-29 NOTE — Progress Notes (Signed)
BP 119/81 (BP Location: Left Arm, Patient Position: Sitting, Cuff Size: Normal)   Pulse (!) 105   Temp 97.9 F (36.6 C) (Oral)   Ht 5' 2.4" (1.585 m)   Wt 154 lb 12.8 oz (70.2 kg)   SpO2 99%   BMI 27.95 kg/m    Subjective:    Patient ID: Sandra Wright, female    DOB: 12/11/1975, 44 y.o.   MRN: [id number]  HPI: Sandra Wright is a 44 y.o. female presenting on 11/29/2019 for comprehensive medical examination. Current medical complaints include:  DEPRESSION Mood status: controlled Satisfied with current treatment?: yes Symptom severity: mild  Duration of current treatment : chronic Side effects: no Medication compliance: excellent compliance Psychotherapy/counseling: no  Previous psychiatric medications: celexa, buspar Depressed mood: no Anxious mood: no Anhedonia: no Significant weight loss or gain: no Insomnia: no  Fatigue: no Feelings of worthlessness or guilt: no Impaired concentration/indecisiveness: no Suicidal ideations: no Hopelessness: no Crying spells: no Depression screen [facility] 2/9 11/29/2019 09/16/2019 08/10/2018 10/16/2017 06/03/2017  Decreased Interest 0 0 0 0 0  Down, Depressed, Hopeless 0 0 0 0 0  PHQ - 2 Score 0 0 0 0 0  Altered sleeping 0 0 0 0 0  Tired, decreased energy 0 0 0 1 2  Change in appetite 0 0 0 0 0  Feeling bad or failure about yourself  0 0 0 0 0  Trouble concentrating 0 0 0 0 0  Moving slowly or fidgety/restless 0 0 0 0 0  Suicidal thoughts 0 0 0 0 0  PHQ-9 Score 0 0 0 1 2  Difficult doing work/chores Not difficult at all Not difficult at all Not difficult at all Not difficult at all -   Menopausal Symptoms: no  Depression Screen done today and results listed below:  Depression screen [facility] 2/9 11/29/2019 09/16/2019 08/10/2018 10/16/2017 06/03/2017  Decreased Interest 0 0 0 0 0  Down, Depressed, Hopeless 0 0 0 0 0  PHQ - 2 Score 0 0 0 0 0  Altered sleeping 0 0 0 0 0  Tired, decreased energy 0 0 0 1 2  Change in appetite 0 0 0 0 0  Feeling  bad or failure about yourself  0 0 0 0 0  Trouble concentrating 0 0 0 0 0  Moving slowly or fidgety/restless 0 0 0 0 0  Suicidal thoughts 0 0 0 0 0  PHQ-9 Score 0 0 0 1 2  Difficult doing work/chores Not difficult at all Not difficult at all Not difficult at all Not difficult at all -    Past Medical History:  Past Medical History:  Diagnosis Date  . Degenerative cervical disc   . Endometriosis     Surgical History:  Past Surgical History:  Procedure Laterality Date  . AUGMENTATION MAMMAPLASTY  2001  . BREAST ENHANCEMENT SURGERY    . RIGHT OOPHORECTOMY Right     Medications:  Current Outpatient Medications on File Prior to Visit  Medication Sig  . levonorgestrel-ethinyl estradiol (SEASONALE,INTROVALE,JOLESSA) 0.15-0.03 MG tablet Take 1 tablet by mouth daily.  . phentermine (ADIPEX-P) 37.5 MG tablet Take 37.5 mg by mouth every morning.  . valACYclovir (VALTREX) 1000 MG tablet Take 1 tablet (1,000 mg total) by mouth daily.   No current facility-administered medications on file prior to visit.    Allergies:  Allergies  Allergen Reactions  . Percocet [Oxycodone-Acetaminophen] Nausea And Vomiting    Social History:  Social History   Socioeconomic History  . Marital  status: Married    Spouse name: Not on file  . Number of children: Not on file  . Years of education: Not on file  . Highest education level: Not on file  Occupational History  . Not on file  Tobacco Use  . Smoking status: Former Smoker    Packs/day: 1.00    Years: 15.00    Pack years: 15.00    Types: Cigarettes    Quit date: 05/19/2002    Years since quitting: 17.5  . Smokeless tobacco: Never Used  Vaping Use  . Vaping Use: Never used  Substance and Sexual Activity  . Alcohol use: Not Currently  . Drug use: No  . Sexual activity: Yes    Birth control/protection: Pill  Other Topics Concern  . Not on file  Social History Narrative  . Not on file   Social Determinants of Health   Financial  Resource Strain:   . Difficulty of Paying Living Expenses:   Food Insecurity:   . Worried About Programme researcher, broadcasting/film/video in the Last Year:   . Barista in the Last Year:   Transportation Needs:   . Freight forwarder (Medical):   Marland Kitchen Lack of Transportation (Non-Medical):   Physical Activity:   . Days of Exercise per Week:   . Minutes of Exercise per Session:   Stress:   . Feeling of Stress :   Social Connections:   . Frequency of Communication with Friends and Family:   . Frequency of Social Gatherings with Friends and Family:   . Attends Religious Services:   . Active Member of Clubs or Organizations:   . Attends Banker Meetings:   Marland Kitchen Marital Status:   Intimate Partner Violence:   . Fear of Current or Ex-Partner:   . Emotionally Abused:   Marland Kitchen Physically Abused:   . Sexually Abused:    Social History   Tobacco Use  Smoking Status Former Smoker  . Packs/day: 1.00  . Years: 15.00  . Pack years: 15.00  . Types: Cigarettes  . Quit date: 05/19/2002  . Years since quitting: 17.5  Smokeless Tobacco Never Used   Social History   Substance and Sexual Activity  Alcohol Use Not Currently    Family History:  Family History  Problem Relation Age of Onset  . Arthritis Mother   . Diabetes Mother   . Hypertension Mother   . Breast cancer Paternal Grandmother   . Bipolar disorder Paternal Grandmother   . Cancer Neg Hx   . COPD Neg Hx   . Heart disease Neg Hx   . Stroke Neg Hx     Past medical history, surgical history, medications, allergies, family history and social history reviewed with patient today and changes made to appropriate areas of the chart.   Review of Systems  Constitutional: Negative.   HENT: Negative.   Eyes: Negative.   Respiratory: Negative.   Cardiovascular: Positive for palpitations. Negative for chest pain, orthopnea, claudication, leg swelling and PND.  Gastrointestinal: Negative.   Genitourinary: Negative.   Musculoskeletal:  Negative.   Skin: Negative.   Neurological: Negative.   Endo/Heme/Allergies: Positive for environmental allergies and polydipsia. Does not bruise/bleed easily.  Psychiatric/Behavioral: Negative.     All other ROS negative except what is listed above and in the HPI.      Objective:    BP 119/81 (BP Location: Left Arm, Patient Position: Sitting, Cuff Size: Normal)   Pulse (!) 105   Temp 97.9 F (  36.6 C) (Oral)   Ht 5' 2.4" (1.585 m)   Wt 154 lb 12.8 oz (70.2 kg)   SpO2 99%   BMI 27.95 kg/m   Wt Readings from Last 3 Encounters:  11/29/19 154 lb 12.8 oz (70.2 kg)  09/15/19 157 lb 12.8 oz (71.6 kg)  05/09/19 150 lb (68 kg)    Physical Exam Vitals and nursing note reviewed.  Constitutional:      General: She is not in acute distress.    Appearance: Normal appearance. She is not ill-appearing, toxic-appearing or diaphoretic.  HENT:     Head: Normocephalic and atraumatic.     Right Ear: Tympanic membrane, ear canal and external ear normal. There is no impacted cerumen.     Left Ear: Tympanic membrane, ear canal and external ear normal. There is no impacted cerumen.     Nose: Nose normal. No congestion or rhinorrhea.     Mouth/Throat:     Mouth: Mucous membranes are moist.     Pharynx: Oropharynx is clear. No oropharyngeal exudate or posterior oropharyngeal erythema.  Eyes:     General: No scleral icterus.       Right eye: No discharge.        Left eye: No discharge.     Extraocular Movements: Extraocular movements intact.     Conjunctiva/sclera: Conjunctivae normal.     Pupils: Pupils are equal, round, and reactive to light.  Neck:     Vascular: No carotid bruit.  Cardiovascular:     Rate and Rhythm: Normal rate and regular rhythm.     Pulses: Normal pulses.     Heart sounds: No murmur heard.  No friction rub. No gallop.   Pulmonary:     Effort: Pulmonary effort is normal. No respiratory distress.     Breath sounds: Normal breath sounds. No stridor. No wheezing, rhonchi  or rales.  Chest:     Chest wall: No tenderness.  Abdominal:     General: Abdomen is flat. Bowel sounds are normal. There is no distension.     Palpations: Abdomen is soft. There is no mass.     Tenderness: There is no abdominal tenderness. There is no right CVA tenderness, left CVA tenderness, guarding or rebound.     Hernia: No hernia is present.  Genitourinary:    Comments: Breast and pelvic exams deferred with shared decision making Musculoskeletal:        General: No swelling, tenderness, deformity or signs of injury.     Cervical back: Normal range of motion and neck supple. No rigidity. No muscular tenderness.     Right lower leg: No edema.     Left lower leg: No edema.  Lymphadenopathy:     Cervical: No cervical adenopathy.  Skin:    General: Skin is warm and dry.     Capillary Refill: Capillary refill takes less than 2 seconds.     Coloration: Skin is not jaundiced or pale.     Findings: No bruising, erythema, lesion or rash.  Neurological:     General: No focal deficit present.     Mental Status: She is alert and oriented to person, place, and time. Mental status is at baseline.     Cranial Nerves: No cranial nerve deficit.     Sensory: No sensory deficit.     Motor: No weakness.     Coordination: Coordination normal.     Gait: Gait normal.     Deep Tendon Reflexes: Reflexes normal.  Psychiatric:  Mood and Affect: Mood normal.        Behavior: Behavior normal.        Thought Content: Thought content normal.        Judgment: Judgment normal.     Results for orders placed or performed in visit on 11/29/19  HM PAP SMEAR  Result Value Ref Range   HM Pap smear Negative with negative HPV       Assessment & Plan:   Problem List Items Addressed This Visit      Other   Depression, major, recurrent, in remission (HCC)    Under good control on current regimen. Continue current regimen. Continue to monitor. Call with any concerns. Refills given.         Relevant Medications   citalopram (CELEXA) 40 MG tablet   busPIRone (BUSPAR) 5 MG tablet   Other Relevant Orders   CBC with Differential/Platelet   Comprehensive metabolic panel   TSH   Recovering alcoholic (HCC)    Doing well. No concerns.       Other Visit Diagnoses    Routine general medical examination at a health care facility    -  Primary   Vaccines up to date. Screening labs drawn today. Continue to monitor. Pap up to date. Mammo due in August. Continue diet and exercise. Call with any concerns.    Relevant Orders   CBC with Differential/Platelet   Comprehensive metabolic panel   Lipid Panel w/o Chol/HDL Ratio   TSH   Urinalysis, Routine w reflex microscopic   Need for hepatitis C screening test       Labs drawn today. Await results.    Relevant Orders   Hepatitis C antibody       Follow up plan: Return in about 6 months (around 05/31/2020).   LABORATORY TESTING:  - Pap smear: up to date  IMMUNIZATIONS:   - Tdap: Tetanus vaccination status reviewed: last tetanus booster within 10 years. - Influenza: Up to date - Pneumovax: Not applicable  SCREENING: -Mammogram: Ordered today   PATIENT COUNSELING:   Advised to take 1 mg of folate supplement per day if capable of pregnancy.   Sexuality: Discussed sexually transmitted diseases, partner selection, use of condoms, avoidance of unintended pregnancy  and contraceptive alternatives.   Advised to avoid cigarette smoking.  I discussed with the patient that most people either abstain from alcohol or drink within safe limits (<=14/week and <=4 drinks/occasion for males, <=7/weeks and <= 3 drinks/occasion for females) and that the risk for alcohol disorders and other health effects rises proportionally with the number of drinks per week and how often a drinker exceeds daily limits.  Discussed cessation/primary prevention of drug use and availability of treatment for abuse.   Diet: Encouraged to adjust caloric intake to  maintain  or achieve ideal body weight, to reduce intake of dietary saturated fat and total fat, to limit sodium intake by avoiding high sodium foods and not adding table salt, and to maintain adequate dietary potassium and calcium preferably from fresh fruits, vegetables, and low-fat dairy products.    stressed the importance of regular exercise  Injury prevention: Discussed safety belts, safety helmets, smoke detector, smoking near bedding or upholstery.   Dental health: Discussed importance of regular tooth brushing, flossing, and dental visits.    NEXT PREVENTATIVE PHYSICAL DUE IN 1 YEAR. Return in about 6 months (around 05/31/2020).

## 2019-11-29 NOTE — Assessment & Plan Note (Signed)
Under good control on current regimen. Continue current regimen. Continue to monitor. Call with any concerns. Refills given.   

## 2019-11-30 LAB — LIPID PANEL W/O CHOL/HDL RATIO
Cholesterol, Total: 219 mg/dL — ABNORMAL HIGH (ref 100–199)
HDL: 53 mg/dL (ref >39)
LDL Chol Calc (NIH): 147 mg/dL — ABNORMAL HIGH (ref 0–99)
Triglycerides: 104 mg/dL (ref 0–149)
VLDL Cholesterol Cal: 19 mg/dL (ref 5–40)

## 2019-11-30 LAB — COMPREHENSIVE METABOLIC PANEL
ALT: 43 IU/L — ABNORMAL HIGH (ref 0–32)
AST: 26 IU/L (ref 0–40)
Albumin/Globulin Ratio: 2.1 (ref 1.2–2.2)
Albumin: 4.6 g/dL (ref 3.8–4.8)
Alkaline Phosphatase: 76 IU/L (ref 48–121)
BUN/Creatinine Ratio: 10 (ref 9–23)
BUN: 8 mg/dL (ref 6–24)
Bilirubin Total: 0.3 mg/dL (ref 0.0–1.2)
CO2: 23 mmol/L (ref 20–29)
Calcium: 9.6 mg/dL (ref 8.7–10.2)
Chloride: 103 mmol/L (ref 96–106)
Creatinine, Ser: 0.84 mg/dL (ref 0.57–1.00)
GFR calc Af Amer: 99 mL/min/{1.73_m2} (ref >59)
GFR calc non Af Amer: 86 mL/min/{1.73_m2} (ref >59)
Globulin, Total: 2.2 g/dL (ref 1.5–4.5)
Glucose: 78 mg/dL (ref 65–99)
Potassium: 4.1 mmol/L (ref 3.5–5.2)
Sodium: 139 mmol/L (ref 134–144)
Total Protein: 6.8 g/dL (ref 6.0–8.5)

## 2019-11-30 LAB — CBC WITH DIFFERENTIAL/PLATELET
Basophils Absolute: 0.1 10*3/uL (ref 0.0–0.2)
Basos: 1 %
EOS (ABSOLUTE): 0.4 10*3/uL (ref 0.0–0.4)
Eos: 5 %
Hematocrit: 41 % (ref 34.0–46.6)
Hemoglobin: 13.6 g/dL (ref 11.1–15.9)
Immature Grans (Abs): 0 10*3/uL (ref 0.0–0.1)
Immature Granulocytes: 0 %
Lymphocytes Absolute: 2.9 10*3/uL (ref 0.7–3.1)
Lymphs: 32 %
MCH: 32 pg (ref 26.6–33.0)
MCHC: 33.2 g/dL (ref 31.5–35.7)
MCV: 97 fL (ref 79–97)
Monocytes Absolute: 0.6 10*3/uL (ref 0.1–0.9)
Monocytes: 7 %
Neutrophils Absolute: 4.8 10*3/uL (ref 1.4–7.0)
Neutrophils: 55 %
Platelets: 436 10*3/uL (ref 150–450)
RBC: 4.25 x10E6/uL (ref 3.77–5.28)
RDW: 13 % (ref 11.7–15.4)
WBC: 8.9 10*3/uL (ref 3.4–10.8)

## 2019-11-30 LAB — HEPATITIS C ANTIBODY: Hep C Virus Ab: <0.1 s/co ratio (ref 0.0–0.9)

## 2019-11-30 LAB — TSH: TSH: 0.526 u[IU]/mL (ref 0.450–4.500)

## 2019-12-02 ENCOUNTER — Encounter: Payer: Self-pay | Admitting: Family Medicine

## 2020-01-19 ENCOUNTER — Ambulatory Visit: Payer: Managed Care, Other (non HMO) | Admitting: Dermatology

## 2020-03-19 ENCOUNTER — Encounter: Payer: Self-pay | Admitting: Family Medicine

## 2020-03-19 ENCOUNTER — Ambulatory Visit (INDEPENDENT_AMBULATORY_CARE_PROVIDER_SITE_OTHER): Payer: Managed Care, Other (non HMO) | Admitting: Family Medicine

## 2020-03-19 ENCOUNTER — Other Ambulatory Visit: Payer: Self-pay

## 2020-03-19 VITALS — BP 119/73 | HR 105 | Temp 98.4°F | Ht 62.0 in | Wt 154.4 lb

## 2020-03-19 DIAGNOSIS — M62838 Other muscle spasm: Secondary | ICD-10-CM | POA: Diagnosis not present

## 2020-03-19 MED ORDER — KETOROLAC TROMETHAMINE 60 MG/2ML IM SOLN
60.0000 mg | Freq: Once | INTRAMUSCULAR | Status: AC
  Administered 2020-03-19: 60 mg via INTRAMUSCULAR

## 2020-03-19 MED ORDER — CYCLOBENZAPRINE HCL 10 MG PO TABS: 10.0000 mg | ORAL_TABLET | Freq: Every day | ORAL | 0 refills | Status: DC

## 2020-03-19 NOTE — Progress Notes (Signed)
BP 119/73   Pulse (!) 105   Temp 98.4 F (36.9 C) (Oral)   Ht 5\' 2"  (1.575 m)   Wt 154 lb 6.4 oz (70 kg)   SpO2 98%   BMI 28.24 kg/m    Subjective:    Patient ID: Sandra Wright , female    DOB: 1975-06-25, 44 y.o.   MRN: [id number]  HPI: Sandra Wright [id number] is a 44 y.o. female  Chief Complaint  Patient presents with  . Headache    started week ago, wont go away.   46    patient wants to check to see if she is perimenapausal   [place] on the L side x 1 week.  Better with being still, worse with anything. No benefit from tylenol, ibuprofen, aspirin. Cold packs, heat Duration: 1 week Onset: sudden Severity: severe Quality: aching, tight, sore Frequency: constant Location:  Headache duration: Radiation: yes, L arm and shoulder Headache status at time of visit: current headache Treatments attempted: Treatments attempted: rest, ice, heat, APAP, ibuprofen and aleve", excedrine   Aura: no Nausea:  yes Vomiting: no Photophobia:  yes Phonophobia:  yes Effect on social functioning:  yes Confusion:  no Gait disturbance/ataxia:  no Behavioral changes:  no Fevers:  no  Relevant past medical, surgical, family and social history reviewed and updated as indicated. Interim medical history since our last visit reviewed. Allergies and medications reviewed and updated.  Review of Systems  Constitutional: Negative.   Respiratory: Negative.   Cardiovascular: Negative.   Gastrointestinal: Negative.   Musculoskeletal: Positive for myalgias, neck pain and neck stiffness. Negative for arthralgias, back pain, gait problem and joint swelling.  Skin: Negative.   Psychiatric/Behavioral: Negative.     Per HPI unless specifically indicated above     Objective:    BP 119/73   Pulse (!) 105   Temp 98.4 F (36.9 C) (Oral)   Ht 5\' 2"  (1.575 m)   Wt 154 lb 6.4 oz (70 kg)   SpO2 98%   BMI 28.24 kg/m   Wt Readings from Last 3 Encounters:  03/19/20 154 lb 6.4 oz (70 kg)    11/29/19 154 lb 12.8 oz (70.2 kg)  09/15/19 157 lb 12.8 oz (71.6 kg)    Physical Exam Vitals and nursing note reviewed.  Constitutional:      General: She is not in acute distress.    Appearance: Normal appearance. She is not ill-appearing, toxic-appearing or diaphoretic.  HENT:     Head: Normocephalic and atraumatic.     Right Ear: External ear normal.     Left Ear: External ear normal.     Nose: Nose normal.     Mouth/Throat:     Mouth: Mucous membranes are moist.     Pharynx: Oropharynx is clear.  Eyes:     General: No scleral icterus.       Right eye: No discharge.        Left eye: No discharge.     Extraocular Movements: Extraocular movements intact.     Conjunctiva/sclera: Conjunctivae normal.     Pupils: Pupils are equal, round, and reactive to light.  Cardiovascular:     Rate and Rhythm: Normal rate and regular rhythm.     Pulses: Normal pulses.     Heart sounds: Normal heart sounds. No murmur heard.  No friction rub. No gallop.   Pulmonary:     Effort: Pulmonary effort is normal. No respiratory distress.     Breath sounds: Normal breath sounds.  No stridor. No wheezing, rhonchi or rales.  Chest:     Chest wall: No tenderness.  Musculoskeletal:        General: Tenderness (tenderness and spasm in L SCM and trap) present. Normal range of motion.     Cervical back: Normal range of motion and neck supple.  Skin:    General: Skin is warm and dry.     Capillary Refill: Capillary refill takes less than 2 seconds.     Coloration: Skin is not jaundiced or pale.     Findings: No bruising, erythema, lesion or rash.  Neurological:     General: No focal deficit present.     Mental Status: She is alert and oriented to person, place, and time. Mental status is at baseline.  Psychiatric:        Mood and Affect: Mood normal.        Behavior: Behavior normal.        Thought Content: Thought content normal.        Judgment: Judgment normal.     Results for orders placed or  performed in visit on 11/29/19  Microscopic Examination   Urine  Result Value Ref Range   WBC, UA 6-10 (A) 0 - 5 /hpf   RBC 3-10 (A) 0 - 2 /hpf   Epithelial Cells (non renal) 0-10 0 - 10 /hpf   Bacteria, UA Many (A) None seen/Few  Hepatitis C antibody  Result Value Ref Range   Hep C Virus Ab <0.1 0.0 - 0.9 s/co ratio  CBC with Differential/Platelet  Result Value Ref Range   WBC 8.9 3.4 - 10.8 x10E3/uL   RBC 4.25 3.77 - 5.28 x10E6/uL   Hemoglobin 13.6 11.1 - 15.9 g/dL   Hematocrit 54.9 82.6 - 46.6 %   MCV 97 79 - 97 fL   MCH 32.0 26.6 - 33.0 pg   MCHC 33.2 31 - 35 g/dL   RDW 41.5 83.0 - 94.0 %   Platelets 436 150 - 450 x10E3/uL   Neutrophils 55 Not Estab. %   Lymphs 32 Not Estab. %   Monocytes 7 Not Estab. %   Eos 5 Not Estab. %   Basos 1 Not Estab. %   Neutrophils Absolute 4.8 1.40 - 7.00 x10E3/uL   Lymphocytes Absolute 2.9 0 - 3 x10E3/uL   Monocytes Absolute 0.6 0 - 0 x10E3/uL   EOS (ABSOLUTE) 0.4 0.0 - 0.4 x10E3/uL   Basophils Absolute 0.1 0 - 0 x10E3/uL   Immature Granulocytes 0 Not Estab. %   Immature Grans (Abs) 0.0 0.0 - 0.1 x10E3/uL  Comprehensive metabolic panel  Result Value Ref Range   Glucose 78 65 - 99 mg/dL   BUN 8 6 - 24 mg/dL   Creatinine, Ser 7.68 0.57 - 1.00 mg/dL   GFR calc non Af Amer 86 >59 mL/min/1.73   GFR calc Af Amer 99 >59 mL/min/1.73   BUN/Creatinine Ratio 10 9 - 23   Sodium 139 134 - 144 mmol/L   Potassium 4.1 3.5 - 5.2 mmol/L   Chloride 103 96 - 106 mmol/L   CO2 23 20 - 29 mmol/L   Calcium 9.6 8.7 - 10.2 mg/dL   Total Protein 6.8 6.0 - 8.5 g/dL   Albumin 4.6 3.8 - 4.8 g/dL   Globulin, Total 2.2 1.5 - 4.5 g/dL   Albumin/Globulin Ratio 2.1 1.2 - 2.2   Bilirubin Total 0.3 0.0 - 1.2 mg/dL   Alkaline Phosphatase 76 48 - 121 IU/L   AST 26 0 -  40 IU/L   ALT 43 (H) 0 - 32 IU/L  Lipid Panel w/o Chol/HDL Ratio  Result Value Ref Range   Cholesterol, Total 219 (H) 100 - 199 mg/dL   Triglycerides 888 0 - 149 mg/dL   HDL 53 >91 mg/dL   VLDL  Cholesterol Cal 19 5 - 40 mg/dL   LDL Chol Calc (NIH) 694 (H) 0 - 99 mg/dL  TSH  Result Value Ref Range   TSH 0.526 0.450 - 4.500 uIU/mL  Urinalysis, Routine w reflex microscopic  Result Value Ref Range   Specific Gravity, UA 1.010 1.005 - 1.030   pH, UA 6.5 5.0 - 7.5   Color, UA Yellow Yellow   Appearance Ur Clear Clear   Leukocytes,UA 1+ (A) Negative   Protein,UA Negative Negative/Trace   Glucose, UA Negative Negative   Ketones, UA Negative Negative   RBC, UA 1+ (A) Negative   Bilirubin, UA Negative Negative   Urobilinogen, Ur 0.2 0.2 - 1.0 mg/dL   Nitrite, UA Negative Negative   Microscopic Examination See below:   HM PAP SMEAR  Result Value Ref Range   HM Pap smear Negative with negative HPV       Assessment & Plan:   Problem List Items Addressed This Visit    None    Visit Diagnoses    Trapezius muscle spasm    -  Primary   Will treat with toradol, flexeril and stretches. Call if not getting better or getting worse. Continue to monitor. Call with any concerns.    Relevant Medications   ketorolac (TORADOL) injection 60 mg       Follow up plan: Return if symptoms worsen or fail to improve.

## 2020-03-19 NOTE — Patient Instructions (Signed)

## 2020-05-29 ENCOUNTER — Other Ambulatory Visit: Payer: Self-pay | Admitting: Family Medicine

## 2020-05-29 NOTE — Telephone Encounter (Signed)
Requested medication (s) are due for refill today - unsure  Requested medication (s) are on the active medication list -yes  Future visit scheduled -no  Last refill: 03/19/20  Notes to clinic: Request non delegated rx  Requested Prescriptions  Pending Prescriptions Disp Refills   cyclobenzaprine (FLEXERIL) 10 MG tablet [Pharmacy Med Name: Cyclobenzaprine HCl 10 MG Oral Tablet] 30 tablet 0    Sig: TAKE 1 TABLET BY MOUTH AT BEDTIME      Not Delegated - Analgesics:  Muscle Relaxants Failed - 05/29/2020  9:14 AM      Failed - This refill cannot be delegated      Passed - Valid encounter within last 6 months    Recent Outpatient Visits           2 months ago Trapezius muscle spasm   [facility] [place], Megan P, DO   6 months ago Routine general medical examination at a health care facility   [facility], [place] P, DO   8 months ago Swollen lymph nodes   [facility] [place], Megan P, DO   8 months ago 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   [facility] [place], [place], DO   9 months ago Rash   [facility] [place], [place], DO                    Requested Prescriptions  Pending Prescriptions Disp Refills   cyclobenzaprine (FLEXERIL) 10 MG tablet [Pharmacy Med Name: Cyclobenzaprine HCl 10 MG Oral Tablet] 30 tablet 0    Sig: TAKE 1 TABLET BY MOUTH AT BEDTIME      Not Delegated - Analgesics:  Muscle Relaxants Failed - 05/29/2020  9:14 AM      Failed - This refill cannot be delegated      Passed - Valid encounter within last 6 months    Recent Outpatient Visits           2 months ago Trapezius muscle spasm   [facility] [place], [place] P, DO   6 months ago Routine general medical examination at a health care facility   [facility], [place] P, DO   8 months ago Swollen lymph nodes   [facility] [place], Megan P, DO   8 months ago  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

## 2020-05-30 ENCOUNTER — Other Ambulatory Visit: Payer: Self-pay

## 2020-06-08 ENCOUNTER — Other Ambulatory Visit: Payer: Self-pay | Admitting: Family Medicine

## 2020-09-04 ENCOUNTER — Other Ambulatory Visit: Payer: Self-pay | Admitting: Family Medicine

## 2020-09-04 NOTE — Telephone Encounter (Signed)
Requested medication (s) are due for refill today: no  Requested medication (s) are on the active medication list: yes   Last refill: 05/29/2020  Future visit scheduled: no  Notes to clinic:  this refill cannot be delegated    Requested Prescriptions  Pending Prescriptions Disp Refills   cyclobenzaprine (FLEXERIL) 10 MG tablet [Pharmacy Med Name: Cyclobenzaprine HCl 10 MG Oral Tablet] 30 tablet 0    Sig: TAKE 1 TABLET BY MOUTH AT BEDTIME      Not Delegated - Analgesics:  Muscle Relaxants Failed - 09/04/2020  8:49 AM      Failed - This refill cannot be delegated      Passed - Valid encounter within last 6 months    Recent Outpatient Visits           5 months ago Trapezius muscle spasm   [facility] [place], Megan P, DO   9 months ago Routine general medical examination at a health care facility   [facility], [place] P, DO   11 months ago Swollen lymph nodes   [facility] [place], Megan P, DO   12 months ago 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

## 2020-09-04 NOTE — Telephone Encounter (Signed)
Last visit 11/1 note says return as needed

## 2020-09-14 ENCOUNTER — Other Ambulatory Visit: Payer: Self-pay | Admitting: Family Medicine

## 2020-09-14 NOTE — Telephone Encounter (Signed)
Requested Prescriptions  Pending Prescriptions Disp Refills  . citalopram (CELEXA) 40 MG tablet [Pharmacy Med Name: Citalopram Hydrobromide 40 MG Oral Tablet] 90 tablet 0    Sig: Take 1 tablet by mouth once daily     Psychiatry:  Antidepressants - SSRI Passed - 09/14/2020  8:31 AM      Passed - Completed PHQ-2 or PHQ-9 in the last 360 days      Passed - Valid encounter within last 6 months    Recent Outpatient Visits          5 months ago Trapezius muscle spasm   [facility] [place], Megan P, DO   9 months ago Routine general medical examination at a health care facility   [facility], [place] P, DO   1 year ago Swollen lymph nodes   [facility] [place], Megan P, DO   1 year ago 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 valACYclovir (VALTREX) 1000 MG tablet [Pharmacy Med Name: valACYclovir HCl 1 GM Oral Tablet] 90 tablet 0    Sig: Take 1 tablet by mouth daily     Antimicrobials:  Antiviral Agents - Anti-Herpetic Passed - 09/14/2020  8:31 AM      Passed - Valid encounter within last 12 months    Recent Outpatient Visits          5 months ago Trapezius muscle spasm   [facility] [place], Megan P, DO   9 months ago Routine general medical examination at a health care facility   [facility], [place] P, DO   1 year ago Swollen lymph nodes   [facility] [place], Megan P, DO   1 year ago 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

## 2020-10-22 ENCOUNTER — Other Ambulatory Visit: Payer: Self-pay | Admitting: Family Medicine

## 2020-10-22 NOTE — Telephone Encounter (Signed)
Lvm to schedule f/u appt 

## 2020-10-22 NOTE — Telephone Encounter (Signed)
Requested medication (s) are due for refill today: no  Requested medication (s) are on the active medication list: yes   Last refill:  09/04/2020  Future visit scheduled: no  Notes to clinic:  this refill cannot be delegated    Requested Prescriptions  Pending Prescriptions Disp Refills   cyclobenzaprine (FLEXERIL) 10 MG tablet [Pharmacy Med Name: Cyclobenzaprine HCl 10 MG Oral Tablet] 30 tablet 0    Sig: TAKE 1 TABLET BY MOUTH AT BEDTIME      Not Delegated - Analgesics:  Muscle Relaxants Failed - 10/22/2020  9:08 AM      Failed - This refill cannot be delegated      Failed - Valid encounter within last 6 months    Recent Outpatient Visits           7 months ago Trapezius muscle spasm   [facility] [place], Megan P, DO   10 months ago Routine general medical examination at a health care facility   [facility] [place], [place] P, DO   1 year ago Swollen lymph nodes   [facility] [place], Megan P, DO   1 year ago 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   [facility] [place], [place], DO   1 year ago Rash   [facility] [place], [place], DO

## 2020-11-02 NOTE — Telephone Encounter (Signed)
Scheduled 7/1

## 2020-11-16 ENCOUNTER — Ambulatory Visit: Payer: Managed Care, Other (non HMO) | Admitting: Family Medicine

## 2020-11-16 ENCOUNTER — Encounter: Payer: Self-pay | Admitting: Family Medicine

## 2020-11-16 ENCOUNTER — Other Ambulatory Visit: Payer: Self-pay

## 2020-11-16 DIAGNOSIS — F3340 Major depressive disorder, recurrent, in remission, unspecified: Secondary | ICD-10-CM | POA: Diagnosis not present

## 2020-11-16 MED ORDER — BUSPIRONE HCL 5 MG PO TABS: 5.0000 mg | ORAL_TABLET | Freq: Three times a day (TID) | ORAL | 1 refills | Status: DC

## 2020-11-16 MED ORDER — CYCLOBENZAPRINE HCL 10 MG PO TABS: 10.0000 mg | ORAL_TABLET | Freq: Every day | ORAL | 6 refills | Status: DC

## 2020-11-16 MED ORDER — VALACYCLOVIR HCL 1 G PO TABS: 1000.0000 mg | ORAL_TABLET | Freq: Every day | ORAL | 3 refills | Status: DC

## 2020-11-16 MED ORDER — MONTELUKAST SODIUM 10 MG PO TABS: 10.0000 mg | ORAL_TABLET | Freq: Every day | ORAL | 0 refills | Status: DC

## 2020-11-16 MED ORDER — CITALOPRAM HYDROBROMIDE 40 MG PO TABS: 40.0000 mg | ORAL_TABLET | Freq: Every day | ORAL | 1 refills | Status: DC

## 2020-11-16 MED ORDER — FLUTICASONE PROPIONATE 50 MCG/ACT NA SUSP: 2.0000 | Freq: Two times a day (BID) | NASAL | 3 refills | Status: DC

## 2020-11-16 NOTE — Progress Notes (Signed)
BP 115/74   Pulse 88   Temp 98.1 F (36.7 C)   SpO2 99%    Subjective:    Patient ID: Sandra Wright, female    DOB: 03/13/1976, 45 y.o.   MRN: [id number]  HPI: Sandra Wright is a 45 y.o. female  Chief Complaint  Patient presents with   Depression   Medication Refill    Patient requesting cyclobenzaprine to be refilled    DEPRESSION Mood status: controlled Satisfied with current treatment?: yes Symptom severity: mild  Duration of current treatment : chronic Side effects: no Medication compliance: excellent compliance Psychotherapy/counseling: no  Previous psychiatric medications: celexa Depressed mood: no Anxious mood: no Anhedonia: no Significant weight loss or gain: no Insomnia: no  Fatigue: no Feelings of worthlessness or guilt: no Impaired concentration/indecisiveness: no Suicidal ideations: no Hopelessness: no Crying spells: no Depression screen [facility] 2/9 11/16/2020 11/29/2019 09/16/2019 08/10/2018 10/16/2017  Decreased Interest 0 0 0 0 0  Down, Depressed, Hopeless 0 0 0 0 0  PHQ - 2 Score 0 0 0 0 0  Altered sleeping 0 0 0 0 0  Tired, decreased energy 0 0 0 0 1  Change in appetite 0 0 0 0 0  Feeling bad or failure about yourself  0 0 0 0 0  Trouble concentrating 0 0 0 0 0  Moving slowly or fidgety/restless 0 0 0 0 0  Suicidal thoughts 0 0 0 0 0  PHQ-9 Score 0 0 0 0 1  Difficult doing work/chores Not difficult at all Not difficult at all Not difficult at all Not difficult at all Not difficult at all  Some recent data might be hidden     Relevant past medical, surgical, family and social history reviewed and updated as indicated. Interim medical history since our last visit reviewed. Allergies and medications reviewed and updated.  Review of Systems  Constitutional: Negative.   Respiratory: Negative.    Cardiovascular: Negative.   Gastrointestinal: Negative.   Musculoskeletal: Negative.   Psychiatric/Behavioral: Negative.     Per HPI unless  specifically indicated above     Objective:    BP 115/74   Pulse 88   Temp 98.1 F (36.7 C)   SpO2 99%   Wt Readings from Last 3 Encounters:  03/19/20 154 lb 6.4 oz (70 kg)  11/29/19 154 lb 12.8 oz (70.2 kg)  09/15/19 157 lb 12.8 oz (71.6 kg)    Physical Exam Vitals and nursing note reviewed.  Constitutional:      General: She is not in acute distress.    Appearance: Normal appearance. She is not ill-appearing, toxic-appearing or diaphoretic.  HENT:     Head: Normocephalic and atraumatic.     Right Ear: External ear normal.     Left Ear: External ear normal.     Nose: Nose normal.     Mouth/Throat:     Mouth: Mucous membranes are moist.     Pharynx: Oropharynx is clear.  Eyes:     General: No scleral icterus.       Right eye: No discharge.        Left eye: No discharge.     Extraocular Movements: Extraocular movements intact.     Conjunctiva/sclera: Conjunctivae normal.     Pupils: Pupils are equal, round, and reactive to light.  Cardiovascular:     Rate and Rhythm: Normal rate and regular rhythm.     Pulses: Normal pulses.     Heart sounds: Normal heart sounds. No murmur  heard.   No friction rub. No gallop.  Pulmonary:     Effort: Pulmonary effort is normal. No respiratory distress.     Breath sounds: Normal breath sounds. No stridor. No wheezing, rhonchi or rales.  Chest:     Chest wall: No tenderness.  Musculoskeletal:        General: Normal range of motion.     Cervical back: Normal range of motion and neck supple.  Skin:    General: Skin is warm and dry.     Capillary Refill: Capillary refill takes less than 2 seconds.     Coloration: Skin is not jaundiced or pale.     Findings: No bruising, erythema, lesion or rash.  Neurological:     General: No focal deficit present.     Mental Status: She is alert and oriented to person, place, and time. Mental status is at baseline.  Psychiatric:        Mood and Affect: Mood normal.        Behavior: Behavior normal.         Thought Content: Thought content normal.        Judgment: Judgment normal.    Results for orders placed or performed in visit on 11/29/19  Microscopic Examination   Urine  Result Value Ref Range   WBC, UA 6-10 (A) 0 - 5 /hpf   RBC 3-10 (A) 0 - 2 /hpf   Epithelial Cells (non renal) 0-10 0 - 10 /hpf   Bacteria, UA Many (A) None seen/Few  Hepatitis C antibody  Result Value Ref Range   Hep C Virus Ab <0.1 0.0 - 0.9 s/co ratio  CBC with Differential/Platelet  Result Value Ref Range   WBC 8.9 3.4 - 10.8 x10E3/uL   RBC 4.25 3.77 - 5.28 x10E6/uL   Hemoglobin 13.6 11.1 - 15.9 g/dL   Hematocrit 68.3 41.9 - 46.6 %   MCV 97 79 - 97 fL   MCH 32.0 26.6 - 33.0 pg   MCHC 33.2 31.5 - 35.7 g/dL   RDW 62.2 29.7 - 98.9 %   Platelets 436 150 - 450 x10E3/uL   Neutrophils 55 Not Estab. %   Lymphs 32 Not Estab. %   Monocytes 7 Not Estab. %   Eos 5 Not Estab. %   Basos 1 Not Estab. %   Neutrophils Absolute 4.8 1.4 - 7.0 x10E3/uL   Lymphocytes Absolute 2.9 0.7 - 3.1 x10E3/uL   Monocytes Absolute 0.6 0.1 - 0.9 x10E3/uL   EOS (ABSOLUTE) 0.4 0.0 - 0.4 x10E3/uL   Basophils Absolute 0.1 0.0 - 0.2 x10E3/uL   Immature Granulocytes 0 Not Estab. %   Immature Grans (Abs) 0.0 0.0 - 0.1 x10E3/uL  Comprehensive metabolic panel  Result Value Ref Range   Glucose 78 65 - 99 mg/dL   BUN 8 6 - 24 mg/dL   Creatinine, Ser 2.11 0.57 - 1.00 mg/dL   GFR calc non Af Amer 86 >59 mL/min/1.73   GFR calc Af Amer 99 >59 mL/min/1.73   BUN/Creatinine Ratio 10 9 - 23   Sodium 139 134 - 144 mmol/L   Potassium 4.1 3.5 - 5.2 mmol/L   Chloride 103 96 - 106 mmol/L   CO2 23 20 - 29 mmol/L   Calcium 9.6 8.7 - 10.2 mg/dL   Total Protein 6.8 6.0 - 8.5 g/dL   Albumin 4.6 3.8 - 4.8 g/dL   Globulin, Total 2.2 1.5 - 4.5 g/dL   Albumin/Globulin Ratio 2.1 1.2 - 2.2   Bilirubin  Total 0.3 0.0 - 1.2 mg/dL   Alkaline Phosphatase 76 48 - 121 IU/L   AST 26 0 - 40 IU/L   ALT 43 (H) 0 - 32 IU/L  Lipid Panel w/o Chol/HDL Ratio   Result Value Ref Range   Cholesterol, Total 219 (H) 100 - 199 mg/dL   Triglycerides 194 0 - 149 mg/dL   HDL 53 >17 mg/dL   VLDL Cholesterol Cal 19 5 - 40 mg/dL   LDL Chol Calc (NIH) 408 (H) 0 - 99 mg/dL  TSH  Result Value Ref Range   TSH 0.526 0.450 - 4.500 uIU/mL  Urinalysis, Routine w reflex microscopic  Result Value Ref Range   Specific Gravity, UA 1.010 1.005 - 1.030   pH, UA 6.5 5.0 - 7.5   Color, UA Yellow Yellow   Appearance Ur Clear Clear   Leukocytes,UA 1+ (A) Negative   Protein,UA Negative Negative/Trace   Glucose, UA Negative Negative   Ketones, UA Negative Negative   RBC, UA 1+ (A) Negative   Bilirubin, UA Negative Negative   Urobilinogen, Ur 0.2 0.2 - 1.0 mg/dL   Nitrite, UA Negative Negative   Microscopic Examination See below:   HM PAP SMEAR  Result Value Ref Range   HM Pap smear Negative with negative HPV       Assessment & Plan:   Problem List Items Addressed This Visit       Other   Depression, major, recurrent, in remission (HCC)    Under good control on current regimen. Continue current regimen. Continue to monitor. Call with any concerns. Refills given.         Relevant Medications   traZODone (DESYREL) 50 MG tablet   busPIRone (BUSPAR) 5 MG tablet   citalopram (CELEXA) 40 MG tablet     Follow up plan: Return in about 6 months (around 05/19/2021) for physical.

## 2020-11-16 NOTE — Assessment & Plan Note (Signed)
Under good control on current regimen. Continue current regimen. Continue to monitor. Call with any concerns. Refills given.   

## 2021-03-07 ENCOUNTER — Other Ambulatory Visit: Payer: Self-pay

## 2021-03-07 ENCOUNTER — Ambulatory Visit: Payer: Managed Care, Other (non HMO) | Admitting: Family Medicine

## 2021-03-07 VITALS — BP 128/85 | HR 103 | Ht 62.0 in | Wt 164.0 lb

## 2021-03-07 DIAGNOSIS — F3340 Major depressive disorder, recurrent, in remission, unspecified: Secondary | ICD-10-CM | POA: Diagnosis not present

## 2021-03-07 DIAGNOSIS — F1021 Alcohol dependence, in remission: Secondary | ICD-10-CM

## 2021-03-07 DIAGNOSIS — R5382 Chronic fatigue, unspecified: Secondary | ICD-10-CM | POA: Diagnosis not present

## 2021-03-07 DIAGNOSIS — Z13220 Encounter for screening for lipoid disorders: Secondary | ICD-10-CM

## 2021-03-07 MED ORDER — NALTREXONE HCL 50 MG PO TABS: 50.0000 mg | ORAL_TABLET | Freq: Every day | ORAL | 3 refills | Status: DC

## 2021-03-07 MED ORDER — MONTELUKAST SODIUM 10 MG PO TABS: 10.0000 mg | ORAL_TABLET | Freq: Every day | ORAL | 3 refills | Status: DC

## 2021-03-07 MED ORDER — BUPROPION HCL ER (SR) 150 MG PO TB12: ORAL_TABLET | ORAL | 3 refills | Status: DC

## 2021-03-07 NOTE — Progress Notes (Signed)
BP 128/85   Pulse (!) 103   Ht 5\' 2"  (1.575 m)   Wt 164 lb (74.4 kg)   BMI 30.00 kg/m    Subjective:    Patient ID: Sandra Wright , female    DOB: [birth year], 45 y.o.   MRN: [id number]  HPI: Sandra Wright [id number] is a 45 y.o. female  Chief Complaint  Patient presents with   Depression   Alcohol Problem    Has relapsed on her drinking. She has been drinking again. Has been taking shots of liqour to make herself do stuff. She has been taking 2-3 hour naps 2x a day. She has been drinking a fifth in 3 days. She doesn't want to keep doing this and would like to stop. She was able to stop on her own in the past. No history of withdrawal. No history of tremors.   DEPRESSION Mood status: exacerbated Satisfied with current treatment?: no Symptom severity: moderate  Duration of current treatment : chronic Side effects: no Medication compliance: excellent compliance Psychotherapy/counseling: no  Previous psychiatric medications: celexa, buspar Depressed mood: yes Anxious mood: yes Anhedonia: no Significant weight loss or gain: no Insomnia: no  Fatigue: yes Feelings of worthlessness or guilt: yes Impaired concentration/indecisiveness: yes Suicidal ideations: no Hopelessness: yes Crying spells: yes Depression screen [facility] 2/9 03/07/2021 11/16/2020 11/29/2019 09/16/2019 08/10/2018  Decreased Interest 1 0 0 0 0  Down, Depressed, Hopeless 1 0 0 0 0  PHQ - 2 Score 2 0 0 0 0  Altered sleeping 2 0 0 0 0  Tired, decreased energy 2 0 0 0 0  Change in appetite 2 0 0 0 0  Feeling bad or failure about yourself  1 0 0 0 0  Trouble concentrating 0 0 0 0 0  Moving slowly or fidgety/restless 0 0 0 0 0  Suicidal thoughts 0 0 0 0 0  PHQ-9 Score 9 0 0 0 0  Difficult doing work/chores Very difficult Not difficult at all Not difficult at all Not difficult at all Not difficult at all  Some recent data might be hidden     Relevant past medical, surgical, family and social history reviewed and updated as  indicated. Interim medical history since our last visit reviewed. Allergies and medications reviewed and updated.  Review of Systems  Constitutional: Negative.   Respiratory: Negative.    Cardiovascular: Negative.   Gastrointestinal: Negative.   Musculoskeletal: Negative.   Psychiatric/Behavioral:  Positive for dysphoric mood and sleep disturbance. Negative for agitation, behavioral problems, confusion, decreased concentration, hallucinations, self-injury and suicidal ideas. The patient is nervous/anxious. The patient is not hyperactive.    Per HPI unless specifically indicated above     Objective:    BP 128/85   Pulse (!) 103   Ht 5\' 2"  (1.575 m)   Wt 164 lb (74.4 kg)   BMI 30.00 kg/m   Wt Readings from Last 3 Encounters:  03/07/21 164 lb (74.4 kg)  03/19/20 154 lb 6.4 oz (70 kg)  11/29/19 154 lb 12.8 oz (70.2 kg)    Physical Exam Vitals and nursing note reviewed.  Constitutional:      General: She is not in acute distress.    Appearance: Normal appearance. She is not ill-appearing, toxic-appearing or diaphoretic.  HENT:     Head: Normocephalic and atraumatic.     Right Ear: External ear normal.     Left Ear: External ear normal.     Nose: Nose normal.     Mouth/Throat:  Mouth: Mucous membranes are moist.     Pharynx: Oropharynx is clear.  Eyes:     General: No scleral icterus.       Right eye: No discharge.        Left eye: No discharge.     Extraocular Movements: Extraocular movements intact.     Conjunctiva/sclera: Conjunctivae normal.     Pupils: Pupils are equal, round, and reactive to light.  Cardiovascular:     Rate and Rhythm: Normal rate and regular rhythm.     Pulses: Normal pulses.     Heart sounds: Normal heart sounds. No murmur heard.   No friction rub. No gallop.  Pulmonary:     Effort: Pulmonary effort is normal. No respiratory distress.     Breath sounds: Normal breath sounds. No stridor. No wheezing, rhonchi or rales.  Chest:     Chest  wall: No tenderness.  Musculoskeletal:        General: Normal range of motion.     Cervical back: Normal range of motion and neck supple.  Skin:    General: Skin is warm and dry.     Capillary Refill: Capillary refill takes less than 2 seconds.     Coloration: Skin is not jaundiced or pale.     Findings: No bruising, erythema, lesion or rash.  Neurological:     General: No focal deficit present.     Mental Status: She is alert and oriented to person, place, and time. Mental status is at baseline.  Psychiatric:        Mood and Affect: Mood normal. Affect is tearful.        Behavior: Behavior normal.        Thought Content: Thought content normal.        Judgment: Judgment normal.    Results for orders placed or performed in visit on 11/29/19  Microscopic Examination   Urine  Result Value Ref Range   WBC, UA 6-10 (A) 0 - 5 /hpf   RBC 3-10 (A) 0 - 2 /hpf   Epithelial Cells (non renal) 0-10 0 - 10 /hpf   Bacteria, UA Many (A) None seen/Few  Hepatitis C antibody  Result Value Ref Range   Hep C Virus Ab <0.1 0.0 - 0.9 s/co ratio  CBC with Differential/Platelet  Result Value Ref Range   WBC 8.9 3.4 - 10.8 x10E3/uL   RBC 4.25 3.77 - 5.28 x10E6/uL   Hemoglobin 13.6 11.1 - 15.9 g/dL   Hematocrit 40.9 81.1 - 46.6 %   MCV 97 79 - 97 fL   MCH 32.0 26.6 - 33.0 pg   MCHC 33.2 31.5 - 35.7 g/dL   RDW 91.4 78.2 - 95.6 %   Platelets 436 150 - 450 x10E3/uL   Neutrophils 55 Not Estab. %   Lymphs 32 Not Estab. %   Monocytes 7 Not Estab. %   Eos 5 Not Estab. %   Basos 1 Not Estab. %   Neutrophils Absolute 4.8 1.4 - 7.0 x10E3/uL   Lymphocytes Absolute 2.9 0.7 - 3.1 x10E3/uL   Monocytes Absolute 0.6 0.1 - 0.9 x10E3/uL   EOS (ABSOLUTE) 0.4 0.0 - 0.4 x10E3/uL   Basophils Absolute 0.1 0.0 - 0.2 x10E3/uL   Immature Granulocytes 0 Not Estab. %   Immature Grans (Abs) 0.0 0.0 - 0.1 x10E3/uL  Comprehensive metabolic panel  Result Value Ref Range   Glucose 78 65 - 99 mg/dL   BUN 8 6 - 24 mg/dL    Creatinine, Ser  0.84 0.57 - 1.00 mg/dL   GFR calc non Af Amer 86 >59 mL/min/1.73   GFR calc Af Amer 99 >59 mL/min/1.73   BUN/Creatinine Ratio 10 9 - 23   Sodium 139 134 - 144 mmol/L   Potassium 4.1 3.5 - 5.2 mmol/L   Chloride 103 96 - 106 mmol/L   CO2 23 20 - 29 mmol/L   Calcium 9.6 8.7 - 10.2 mg/dL   Total Protein 6.8 6.0 - 8.5 g/dL   Albumin 4.6 3.8 - 4.8 g/dL   Globulin, Total 2.2 1.5 - 4.5 g/dL   Albumin/Globulin Ratio 2.1 1.2 - 2.2   Bilirubin Total 0.3 0.0 - 1.2 mg/dL   Alkaline Phosphatase 76 48 - 121 IU/L   AST 26 0 - 40 IU/L   ALT 43 (H) 0 - 32 IU/L  Lipid Panel w/o Chol/HDL Ratio  Result Value Ref Range   Cholesterol, Total 219 (H) 100 - 199 mg/dL   Triglycerides 016 0 - 149 mg/dL   HDL 53 >01 mg/dL   VLDL Cholesterol Cal 19 5 - 40 mg/dL   LDL Chol Calc (NIH) 093 (H) 0 - 99 mg/dL  TSH  Result Value Ref Range   TSH 0.526 0.450 - 4.500 uIU/mL  Urinalysis, Routine w reflex microscopic  Result Value Ref Range   Specific Gravity, UA 1.010 1.005 - 1.030   pH, UA 6.5 5.0 - 7.5   Color, UA Yellow Yellow   Appearance Ur Clear Clear   Leukocytes,UA 1+ (A) Negative   Protein,UA Negative Negative/Trace   Glucose, UA Negative Negative   Ketones, UA Negative Negative   RBC, UA 1+ (A) Negative   Bilirubin, UA Negative Negative   Urobilinogen, Ur 0.2 0.2 - 1.0 mg/dL   Nitrite, UA Negative Negative   Microscopic Examination See below:   HM PAP SMEAR  Result Value Ref Range   HM Pap smear Negative with negative HPV       Assessment & Plan:   Problem List Items Addressed This Visit       Other   Depression, major, recurrent, in remission (HCC) - Primary    Not under good control. Will add in wellbutrin and recheck 1 month. Call with any concerns. Continue celexa.       Relevant Medications   buPROPion (WELLBUTRIN SR) 150 MG 12 hr tablet   Other Relevant Orders   Comprehensive metabolic panel   CBC with Differential/Platelet   TSH   VITAMIN D 25 Hydroxy (Vit-D  Deficiency, Fractures)   Urinalysis, Routine w reflex microscopic   Recovering alcoholic (HCC)    Has relapsed. Discussed stopping drinking and watching for any tremulousness. If she is having any- will go to ER immediately. Will start naltrexone. Continue to monitor closely.      Relevant Orders   Comprehensive metabolic panel   CBC with Differential/Platelet   TSH   VITAMIN D 25 Hydroxy (Vit-D Deficiency, Fractures)   Urinalysis, Routine w reflex microscopic   B12   Other Visit Diagnoses     Screening for cholesterol level       Labs drawn today. Await results.    Relevant Orders   Lipid Panel w/o Chol/HDL Ratio   Chronic fatigue       Labs drawn today. Await results.    Relevant Orders   Comprehensive metabolic panel   CBC with Differential/Platelet   Bayer DCA Hb A1c Waived   TSH   VITAMIN D 25 Hydroxy (Vit-D Deficiency, Fractures)   Urinalysis, Routine  w reflex microscopic        Follow up plan: Return in about 4 weeks (around 04/04/2021).

## 2021-03-16 ENCOUNTER — Encounter: Payer: Self-pay | Admitting: Family Medicine

## 2021-03-16 NOTE — Assessment & Plan Note (Signed)
Has relapsed. Discussed stopping drinking and watching for any tremulousness. If she is having any- will go to ER immediately. Will start naltrexone. Continue to monitor closely.

## 2021-03-16 NOTE — Assessment & Plan Note (Signed)
Not under good control. Will add in wellbutrin and recheck 1 month. Call with any concerns. Continue celexa.

## 2021-04-15 ENCOUNTER — Ambulatory Visit: Payer: Managed Care, Other (non HMO) | Admitting: Family Medicine

## 2021-04-15 ENCOUNTER — Encounter: Payer: Self-pay | Admitting: Family Medicine

## 2021-04-15 ENCOUNTER — Other Ambulatory Visit: Payer: Self-pay

## 2021-04-15 VITALS — BP 119/80 | HR 93 | Temp 98.2°F | Ht 62.0 in | Wt 164.8 lb

## 2021-04-15 DIAGNOSIS — F3340 Major depressive disorder, recurrent, in remission, unspecified: Secondary | ICD-10-CM

## 2021-04-15 DIAGNOSIS — Z23 Encounter for immunization: Secondary | ICD-10-CM

## 2021-04-15 DIAGNOSIS — F1021 Alcohol dependence, in remission: Secondary | ICD-10-CM

## 2021-04-15 MED ORDER — CITALOPRAM HYDROBROMIDE 40 MG PO TABS: 40.0000 mg | ORAL_TABLET | Freq: Every day | ORAL | 1 refills | Status: DC

## 2021-04-15 MED ORDER — BUPROPION HCL ER (SR) 150 MG PO TB12: 150.0000 mg | ORAL_TABLET | Freq: Two times a day (BID) | ORAL | 1 refills | Status: DC

## 2021-04-15 MED ORDER — NALTREXONE HCL 50 MG PO TABS: 50.0000 mg | ORAL_TABLET | Freq: Every day | ORAL | 3 refills | Status: DC

## 2021-04-15 NOTE — Assessment & Plan Note (Signed)
No drinking since last time she was here. Doing well. Continue naltrexone as needed. Call with any concerns.

## 2021-04-15 NOTE — Assessment & Plan Note (Signed)
Doing much better. Continue current regimen. Continue to monitor. Refills given. Call with any concerns.

## 2021-04-15 NOTE — Progress Notes (Signed)
BP 119/80   Pulse 93   Temp 98.2 F (36.8 C)   Ht 5\' 2"  (1.575 m)   Wt 164 lb 12.8 oz (74.8 kg)   SpO2 96%   BMI 30.14 kg/m    Subjective:    Patient ID: [name] , female    DOB: [birth year], 45 y.o.   MRN: [id number]  HPI: Sandra Wright [id number] is a 45 y.o. female  Chief Complaint  Patient presents with   Depression   DEPRESSION Mood status: better Satisfied with current treatment?: yes Symptom severity: mild  Duration of current treatment : chronic Side effects: no Medication compliance: excellent compliance Psychotherapy/counseling: no  Previous psychiatric medications: celexa, wellbutrin Depressed mood: yes Anxious mood: no Anhedonia: no Significant weight loss or gain: no Insomnia: no  Fatigue: yes Feelings of worthlessness or guilt: no Impaired concentration/indecisiveness: no Suicidal ideations: no Hopelessness: no Crying spells: no Depression screen [facility] 2/9 04/15/2021 03/07/2021 11/16/2020 11/29/2019 09/16/2019  Decreased Interest 0 1 0 0 0  Down, Depressed, Hopeless 0 1 0 0 0  PHQ - 2 Score 0 2 0 0 0  Altered sleeping 1 2 0 0 0  Tired, decreased energy 0 2 0 0 0  Change in appetite 0 2 0 0 0  Feeling bad or failure about yourself  0 1 0 0 0  Trouble concentrating 0 0 0 0 0  Moving slowly or fidgety/restless 0 0 0 0 0  Suicidal thoughts 0 0 0 0 0  PHQ-9 Score 1 9 0 0 0  Difficult doing work/chores - Very difficult Not difficult at all Not difficult at all Not difficult at all  Some recent data might be hidden    Relevant past medical, surgical, family and social history reviewed and updated as indicated. Interim medical history since our last visit reviewed. Allergies and medications reviewed and updated.  Review of Systems  Constitutional: Negative.   Respiratory: Negative.    Cardiovascular: Negative.   Gastrointestinal: Negative.   Musculoskeletal: Negative.   Neurological: Negative.   Psychiatric/Behavioral: Negative.     Per HPI unless  specifically indicated above     Objective:    BP 119/80   Pulse 93   Temp 98.2 F (36.8 C)   Ht 5\' 2"  (1.575 m)   Wt 164 lb 12.8 oz (74.8 kg)   SpO2 96%   BMI 30.14 kg/m   Wt Readings from Last 3 Encounters:  04/15/21 164 lb 12.8 oz (74.8 kg)  03/07/21 164 lb (74.4 kg)  03/19/20 154 lb 6.4 oz (70 kg)    Physical Exam Vitals and nursing note reviewed.  Constitutional:      General: She is not in acute distress.    Appearance: Normal appearance. She is not ill-appearing, toxic-appearing or diaphoretic.  HENT:     Head: Normocephalic and atraumatic.     Right Ear: External ear normal.     Left Ear: External ear normal.     Nose: Nose normal.     Mouth/Throat:     Mouth: Mucous membranes are moist.     Pharynx: Oropharynx is clear.  Eyes:     General: No scleral icterus.       Right eye: No discharge.        Left eye: No discharge.     Extraocular Movements: Extraocular movements intact.     Conjunctiva/sclera: Conjunctivae normal.     Pupils: Pupils are equal, round, and reactive to light.  Cardiovascular:     Rate  and Rhythm: Normal rate and regular rhythm.     Pulses: Normal pulses.     Heart sounds: Normal heart sounds. No murmur heard.   No friction rub. No gallop.  Pulmonary:     Effort: Pulmonary effort is normal. No respiratory distress.     Breath sounds: Normal breath sounds. No stridor. No wheezing, rhonchi or rales.  Chest:     Chest wall: No tenderness.  Musculoskeletal:        General: Normal range of motion.     Cervical back: Normal range of motion and neck supple.  Skin:    General: Skin is warm and dry.     Capillary Refill: Capillary refill takes less than 2 seconds.     Coloration: Skin is not jaundiced or pale.     Findings: No bruising, erythema, lesion or rash.  Neurological:     General: No focal deficit present.     Mental Status: She is alert and oriented to person, place, and time. Mental status is at baseline.  Psychiatric:         Mood and Affect: Mood normal.        Behavior: Behavior normal.        Thought Content: Thought content normal.        Judgment: Judgment normal.    Results for orders placed or performed in visit on 11/29/19  Microscopic Examination   Urine  Result Value Ref Range   WBC, UA 6-10 (A) 0 - 5 /hpf   RBC 3-10 (A) 0 - 2 /hpf   Epithelial Cells (non renal) 0-10 0 - 10 /hpf   Bacteria, UA Many (A) None seen/Few  Hepatitis C antibody  Result Value Ref Range   Hep C Virus Ab <0.1 0.0 - 0.9 s/co ratio  CBC with Differential/Platelet  Result Value Ref Range   WBC 8.9 3.4 - 10.8 x10E3/uL   RBC 4.25 3.77 - 5.28 x10E6/uL   Hemoglobin 13.6 11.1 - 15.9 g/dL   Hematocrit 41.0 34.0 - 46.6 %   MCV 97 79 - 97 fL   MCH 32.0 26.6 - 33.0 pg   MCHC 33.2 31.5 - 35.7 g/dL   RDW 13.0 11.7 - 15.4 %   Platelets 436 150 - 450 x10E3/uL   Neutrophils 55 Not Estab. %   Lymphs 32 Not Estab. %   Monocytes 7 Not Estab. %   Eos 5 Not Estab. %   Basos 1 Not Estab. %   Neutrophils Absolute 4.8 1.4 - 7.0 x10E3/uL   Lymphocytes Absolute 2.9 0.7 - 3.1 x10E3/uL   Monocytes Absolute 0.6 0.1 - 0.9 x10E3/uL   EOS (ABSOLUTE) 0.4 0.0 - 0.4 x10E3/uL   Basophils Absolute 0.1 0.0 - 0.2 x10E3/uL   Immature Granulocytes 0 Not Estab. %   Immature Grans (Abs) 0.0 0.0 - 0.1 x10E3/uL  Comprehensive metabolic panel  Result Value Ref Range   Glucose 78 65 - 99 mg/dL   BUN 8 6 - 24 mg/dL   Creatinine, Ser 0.84 0.57 - 1.00 mg/dL   GFR calc non Af Amer 86 >59 mL/min/1.73   GFR calc Af Amer 99 >59 mL/min/1.73   BUN/Creatinine Ratio 10 9 - 23   Sodium 139 134 - 144 mmol/L   Potassium 4.1 3.5 - 5.2 mmol/L   Chloride 103 96 - 106 mmol/L   CO2 23 20 - 29 mmol/L   Calcium 9.6 8.7 - 10.2 mg/dL   Total Protein 6.8 6.0 - 8.5 g/dL   Albumin  4.6 3.8 - 4.8 g/dL   Globulin, Total 2.2 1.5 - 4.5 g/dL   Albumin/Globulin Ratio 2.1 1.2 - 2.2   Bilirubin Total 0.3 0.0 - 1.2 mg/dL   Alkaline Phosphatase 76 48 - 121 IU/L   AST 26 0 - 40  IU/L   ALT 43 (H) 0 - 32 IU/L  Lipid Panel w/o Chol/HDL Ratio  Result Value Ref Range   Cholesterol, Total 219 (H) 100 - 199 mg/dL   Triglycerides 104 0 - 149 mg/dL   HDL 53 >39 mg/dL   VLDL Cholesterol Cal 19 5 - 40 mg/dL   LDL Chol Calc (NIH) 147 (H) 0 - 99 mg/dL  TSH  Result Value Ref Range   TSH 0.526 0.450 - 4.500 uIU/mL  Urinalysis, Routine w reflex microscopic  Result Value Ref Range   Specific Gravity, UA 1.010 1.005 - 1.030   pH, UA 6.5 5.0 - 7.5   Color, UA Yellow Yellow   Appearance Ur Clear Clear   Leukocytes,UA 1+ (A) Negative   Protein,UA Negative Negative/Trace   Glucose, UA Negative Negative   Ketones, UA Negative Negative   RBC, UA 1+ (A) Negative   Bilirubin, UA Negative Negative   Urobilinogen, Ur 0.2 0.2 - 1.0 mg/dL   Nitrite, UA Negative Negative   Microscopic Examination See below:   HM PAP SMEAR  Result Value Ref Range   HM Pap smear Negative with negative HPV       Assessment & Plan:   Problem List Items Addressed This Visit       Other   Depression, major, recurrent, in remission ([place])    Doing much better. Continue current regimen. Continue to monitor. Refills given. Call with any concerns.       Relevant Medications   buPROPion (WELLBUTRIN SR) 150 MG 12 hr tablet   citalopram (CELEXA) 40 MG tablet   Recovering alcoholic (HCC)    No drinking since last time she was here. Doing well. Continue naltrexone as needed. Call with any concerns.       Other Visit Diagnoses     Need for influenza vaccination    -  Primary   Relevant Orders   Flu Vaccine QUAD 6+ mos PF IM (Fluarix Quad PF) (Completed)        Follow up plan: Return in about 6 months (around 10/13/2021), or physical.

## 2021-05-21 ENCOUNTER — Encounter: Payer: Managed Care, Other (non HMO) | Admitting: Family Medicine

## 2021-07-31 ENCOUNTER — Other Ambulatory Visit: Payer: Self-pay | Admitting: Family Medicine

## 2021-08-01 NOTE — Telephone Encounter (Signed)
Requested Prescriptions  ?Pending Prescriptions Disp Refills  ?? busPIRone (BUSPAR) 5 MG tablet [Pharmacy Med Name: busPIRone HCl 5 MG Oral Tablet] 270 tablet 0  ?  Sig: TAKE 1 TO 2 TABLETS BY MOUTH THREE TIMES DAILY  ?  ? Psychiatry: Anxiolytics/Hypnotics - Non-controlled Passed - 07/31/2021  5:28 PM  ?  ?  Passed - Valid encounter within last 12 months  ?  Recent Outpatient Visits   ?      ? 3 months ago Need for influenza vaccination  ? [place] P, DO  ? 4 months ago Depression, major, recurrent, in remission [facility])  ? [place] P, DO  ? 8 months ago Depression, major, recurrent, in remission [facility])  ? [place], [place] P, DO  ? 1 year ago Trapezius muscle spasm  ? [place], DO  ? 1 year ago Routine general medical examination at a health care facility  ? [place] P, DO  ?  ?  ?Future Appointments   ?        ? In 2 months Wynetta Emery, Barb Merino, DO [facility], [facility]  ?  ? ?  ?  ?  ? ?

## 2021-09-30 HISTORY — PX: HERNIA REPAIR: SHX51

## 2021-10-15 ENCOUNTER — Ambulatory Visit: Payer: Managed Care, Other (non HMO) | Admitting: Family Medicine

## 2021-10-15 ENCOUNTER — Encounter: Payer: Self-pay | Admitting: Family Medicine

## 2021-10-15 VITALS — BP 111/73 | HR 93 | Temp 98.5°F | Wt 164.2 lb

## 2021-10-15 DIAGNOSIS — F3340 Major depressive disorder, recurrent, in remission, unspecified: Secondary | ICD-10-CM

## 2021-10-15 DIAGNOSIS — J301 Allergic rhinitis due to pollen: Secondary | ICD-10-CM

## 2021-10-15 DIAGNOSIS — A6000 Herpesviral infection of urogenital system, unspecified: Secondary | ICD-10-CM | POA: Diagnosis not present

## 2021-10-15 DIAGNOSIS — L7634 Postprocedural seroma of skin and subcutaneous tissue following other procedure: Secondary | ICD-10-CM

## 2021-10-15 MED ORDER — VALACYCLOVIR HCL 1 G PO TABS: 1000.0000 mg | ORAL_TABLET | Freq: Every day | ORAL | 3 refills | Status: DC

## 2021-10-15 MED ORDER — CITALOPRAM HYDROBROMIDE 40 MG PO TABS: 40.0000 mg | ORAL_TABLET | Freq: Every day | ORAL | 1 refills | Status: DC

## 2021-10-15 MED ORDER — BUSPIRONE HCL 5 MG PO TABS: ORAL_TABLET | ORAL | 1 refills | Status: DC

## 2021-10-15 MED ORDER — BUPROPION HCL ER (SR) 150 MG PO TB12: 150.0000 mg | ORAL_TABLET | Freq: Two times a day (BID) | ORAL | 1 refills | Status: DC

## 2021-10-15 MED ORDER — FLUTICASONE PROPIONATE 50 MCG/ACT NA SUSP: 2.0000 | Freq: Two times a day (BID) | NASAL | 3 refills | Status: DC

## 2021-10-15 MED ORDER — MONTELUKAST SODIUM 10 MG PO TABS
10.0000 mg | ORAL_TABLET | Freq: Every day | ORAL | 3 refills | Status: DC
Start: 2021-10-15 — End: 2022-12-02

## 2021-10-15 NOTE — Assessment & Plan Note (Signed)
Under good control on current regimen. Continue current regimen. Continue to monitor. Call with any concerns. Refills given.   

## 2021-10-15 NOTE — Progress Notes (Signed)
BP 111/73   Pulse 93   Temp 98.5 F (36.9 C) (Oral)   Wt 164 lb 3.2 oz (74.5 kg)   LMP  (LMP Unknown)   SpO2 98%   BMI 30.03 kg/m    Subjective:    Patient ID: Sandra Wright, female    DOB: 01/24/1976, 46 y.o.   MRN: [id number]  HPI: Sandra Wright is a 46 y.o. female  Chief Complaint  Patient presents with   Depression   Wound Check    Pt states she had surgery 2 weeks ago for an abdominal hernia. States she thinks the incision site may be infected. States she has emailed her Psychologist, sport and exercise but wanted to mention this to [place] as well.    SKIN INFECTION Duration: 2 weeks Location: abdomen History of trauma in area: yes Pain: yes Quality: aching  Severity: moderate Redness: no Swelling: no Oozing: no Pus: no Fevers: no Nausea/vomiting: no Status: stable Treatments attempted:none  Tetanus: UTD   DEPRESSION Mood status: controlled Satisfied with current treatment?: yes Symptom severity: mild  Duration of current treatment : chronic Side effects: no Medication compliance: excellent compliance Psychotherapy/counseling: no  Previous psychiatric medications: wellbutrin, celexa Depressed mood: no Anxious mood: no Anhedonia: no Significant weight loss or gain: no Insomnia: no  Fatigue: no Feelings of worthlessness or guilt: no Impaired concentration/indecisiveness: no Suicidal ideations: no Hopelessness: no Crying spells: no    10/15/2021   11:13 AM 04/15/2021   11:08 AM 03/07/2021   12:28 PM 11/16/2020    9:46 AM 11/29/2019    9:52 AM  Depression screen PHQ 2/9  Decreased Interest 0 0 1 0 0  Down, Depressed, Hopeless 0 0 1 0 0  PHQ - 2 Score 0 0 2 0 0  Altered sleeping 0 1 2 0 0  Tired, decreased energy 0 0 2 0 0  Change in appetite 0 0 2 0 0  Feeling bad or failure about yourself  0 0 1 0 0  Trouble concentrating 0 0 0 0 0  Moving slowly or fidgety/restless 0 0 0 0 0  Suicidal thoughts 0 0 0 0 0  PHQ-9 Score 0 1 9 0 0  Difficult doing work/chores Not  difficult at all  Very difficult Not difficult at all Not difficult at all    Relevant past medical, surgical, family and social history reviewed and updated as indicated. Interim medical history since our last visit reviewed. Allergies and medications reviewed and updated.  Review of Systems  Constitutional: Negative.   Respiratory: Negative.    Cardiovascular: Negative.   Gastrointestinal: Negative.   Musculoskeletal:  Positive for myalgias. Negative for arthralgias, back pain, gait problem, joint swelling, neck pain and neck stiffness.  Neurological: Negative.   Psychiatric/Behavioral: Negative.     Per HPI unless specifically indicated above     Objective:    BP 111/73   Pulse 93   Temp 98.5 F (36.9 C) (Oral)   Wt 164 lb 3.2 oz (74.5 kg)   LMP  (LMP Unknown)   SpO2 98%   BMI 30.03 kg/m   Wt Readings from Last 3 Encounters:  10/15/21 164 lb 3.2 oz (74.5 kg)  04/15/21 164 lb 12.8 oz (74.8 kg)  03/07/21 164 lb (74.4 kg)    Physical Exam Vitals and nursing note reviewed.  Constitutional:      General: She is not in acute distress.    Appearance: Normal appearance. She is not ill-appearing, toxic-appearing or diaphoretic.  HENT:  Head: Normocephalic and atraumatic.     Right Ear: External ear normal.     Left Ear: External ear normal.     Nose: Nose normal.     Mouth/Throat:     Mouth: Mucous membranes are moist.     Pharynx: Oropharynx is clear.  Eyes:     General: No scleral icterus.       Right eye: No discharge.        Left eye: No discharge.     Extraocular Movements: Extraocular movements intact.     Conjunctiva/sclera: Conjunctivae normal.     Pupils: Pupils are equal, round, and reactive to light.  Cardiovascular:     Rate and Rhythm: Normal rate and regular rhythm.     Pulses: Normal pulses.     Heart sounds: Normal heart sounds. No murmur heard.   No friction rub. No gallop.  Pulmonary:     Effort: Pulmonary effort is normal. No respiratory  distress.     Breath sounds: Normal breath sounds. No stridor. No wheezing, rhonchi or rales.  Chest:     Chest wall: No tenderness.  Musculoskeletal:        General: Normal range of motion.     Cervical back: Normal range of motion and neck supple.  Skin:    General: Skin is warm and dry.     Capillary Refill: Capillary refill takes less than 2 seconds.     Coloration: Skin is not jaundiced or pale.     Findings: No bruising, erythema, lesion or rash.     Comments: Well healing abdominal surgical wound on L side, no redness or heat, mild swelling under the skin  Neurological:     General: No focal deficit present.     Mental Status: She is alert and oriented to person, place, and time. Mental status is at baseline.  Psychiatric:        Mood and Affect: Mood normal.        Behavior: Behavior normal.        Thought Content: Thought content normal.        Judgment: Judgment normal.    Results for orders placed or performed in visit on 11/29/19  Microscopic Examination   Urine  Result Value Ref Range   WBC, UA 6-10 (A) 0 - 5 /hpf   RBC 3-10 (A) 0 - 2 /hpf   Epithelial Cells (non renal) 0-10 0 - 10 /hpf   Bacteria, UA Many (A) None seen/Few  Hepatitis C antibody  Result Value Ref Range   Hep C Virus Ab <0.1 0.0 - 0.9 s/co ratio  CBC with Differential/Platelet  Result Value Ref Range   WBC 8.9 3.4 - 10.8 x10E3/uL   RBC 4.25 3.77 - 5.28 x10E6/uL   Hemoglobin 13.6 11.1 - 15.9 g/dL   Hematocrit 41.0 34.0 - 46.6 %   MCV 97 79 - 97 fL   MCH 32.0 26.6 - 33.0 pg   MCHC 33.2 31.5 - 35.7 g/dL   RDW 13.0 11.7 - 15.4 %   Platelets 436 150 - 450 x10E3/uL   Neutrophils 55 Not Estab. %   Lymphs 32 Not Estab. %   Monocytes 7 Not Estab. %   Eos 5 Not Estab. %   Basos 1 Not Estab. %   Neutrophils Absolute 4.8 1.4 - 7.0 x10E3/uL   Lymphocytes Absolute 2.9 0.7 - 3.1 x10E3/uL   Monocytes Absolute 0.6 0.1 - 0.9 x10E3/uL   EOS (ABSOLUTE) 0.4 0.0 - 0.4 x10E3/uL  Basophils Absolute 0.1 0.0  - 0.2 x10E3/uL   Immature Granulocytes 0 Not Estab. %   Immature Grans (Abs) 0.0 0.0 - 0.1 x10E3/uL  Comprehensive metabolic panel  Result Value Ref Range   Glucose 78 65 - 99 mg/dL   BUN 8 6 - 24 mg/dL   Creatinine, Ser 0.84 0.57 - 1.00 mg/dL   GFR calc non Af Amer 86 >59 mL/min/1.73   GFR calc Af Amer 99 >59 mL/min/1.73   BUN/Creatinine Ratio 10 9 - 23   Sodium 139 134 - 144 mmol/L   Potassium 4.1 3.5 - 5.2 mmol/L   Chloride 103 96 - 106 mmol/L   CO2 23 20 - 29 mmol/L   Calcium 9.6 8.7 - 10.2 mg/dL   Total Protein 6.8 6.0 - 8.5 g/dL   Albumin 4.6 3.8 - 4.8 g/dL   Globulin, Total 2.2 1.5 - 4.5 g/dL   Albumin/Globulin Ratio 2.1 1.2 - 2.2   Bilirubin Total 0.3 0.0 - 1.2 mg/dL   Alkaline Phosphatase 76 48 - 121 IU/L   AST 26 0 - 40 IU/L   ALT 43 (H) 0 - 32 IU/L  Lipid Panel w/o Chol/HDL Ratio  Result Value Ref Range   Cholesterol, Total 219 (H) 100 - 199 mg/dL   Triglycerides 104 0 - 149 mg/dL   HDL 53 >39 mg/dL   VLDL Cholesterol Cal 19 5 - 40 mg/dL   LDL Chol Calc (NIH) 147 (H) 0 - 99 mg/dL  TSH  Result Value Ref Range   TSH 0.526 0.450 - 4.500 uIU/mL  Urinalysis, Routine w reflex microscopic  Result Value Ref Range   Specific Gravity, UA 1.010 1.005 - 1.030   pH, UA 6.5 5.0 - 7.5   Color, UA Yellow Yellow   Appearance Ur Clear Clear   Leukocytes,UA 1+ (A) Negative   Protein,UA Negative Negative/Trace   Glucose, UA Negative Negative   Ketones, UA Negative Negative   RBC, UA 1+ (A) Negative   Bilirubin, UA Negative Negative   Urobilinogen, Ur 0.2 0.2 - 1.0 mg/dL   Nitrite, UA Negative Negative   Microscopic Examination See below:   HM PAP SMEAR  Result Value Ref Range   HM Pap smear Negative with negative HPV       Assessment & Plan:   Problem List Items Addressed This Visit       Respiratory   Allergic rhinitis    Under good control on current regimen. Continue current regimen. Continue to monitor. Call with any concerns. Refills given.            Genitourinary   Genital herpes simplex    Under good control on current regimen. Continue current regimen. Continue to monitor. Call with any concerns. Refills given.         Relevant Medications   valACYclovir (VALTREX) 1000 MG tablet     Other   Depression, major, recurrent, in remission ([place]) - Primary    Under good control on current regimen. Continue current regimen. Continue to monitor. Call with any concerns. Refills given.         Relevant Medications   citalopram (CELEXA) 40 MG tablet   buPROPion (WELLBUTRIN SR) 150 MG 12 hr tablet   busPIRone (BUSPAR) 5 MG tablet   Other Visit Diagnoses     Postoperative seroma of skin after non-dermatologic procedure       No sign of infection today. Continue to monitor and call if not getting better.  Follow up plan: Return in about 6 months (around 04/17/2022) for physical .

## 2021-11-09 ENCOUNTER — Other Ambulatory Visit: Payer: Self-pay | Admitting: Family Medicine

## 2021-12-29 ENCOUNTER — Other Ambulatory Visit: Payer: Self-pay | Admitting: Family Medicine

## 2021-12-30 NOTE — Telephone Encounter (Signed)
Requested medication (s) are due for refill today: yes  Requested medication (s) are on the active medication list: yes  Last refill:  11/14/21 #30 0 refills  Future visit scheduled: yes in 3 months  Notes to clinic:  not delegated per protocol.      Requested Prescriptions  Pending Prescriptions Disp Refills   cyclobenzaprine (FLEXERIL) 10 MG tablet [Pharmacy Med Name: Cyclobenzaprine HCl 10 MG Oral Tablet] 30 tablet 0    Sig: TAKE 1 TABLET BY MOUTH AT BEDTIME     Not Delegated - Analgesics:  Muscle Relaxants Failed - 12/29/2021  9:23 PM      Failed - This refill cannot be delegated      Passed - Valid encounter within last 6 months    Recent Outpatient Visits           2 months ago Depression, major, recurrent, in remission (HCC)   [facility] [place], [place] P, DO   8 months ago Need for influenza vaccination   [facility], Megan P, DO   9 months ago Depression, major, recurrent, in remission [facility])   [facility] [place], Megan P, DO   1 year ago Depression, major, recurrent, in remission [facility])   [facility] [place], Megan P, DO   1 year ago Trapezius muscle spasm   [facility] [place], [place], DO       Future Appointments             In 3 months [place], Oralia Rud, DO [facility], [facility]

## 2022-02-09 ENCOUNTER — Other Ambulatory Visit: Payer: Self-pay | Admitting: Family Medicine

## 2022-02-10 NOTE — Telephone Encounter (Signed)
Requested medications are due for refill today.  yes  Requested medications are on the active medications list.  yes  Last refill. 12/30/2021 #30 0 rf  Future visit scheduled.   yes  Notes to clinic.  Refill not delegated.    Requested Prescriptions  Pending Prescriptions Disp Refills   cyclobenzaprine (FLEXERIL) 10 MG tablet [Pharmacy Med Name: Cyclobenzaprine HCl 10 MG Oral Tablet] 30 tablet 0    Sig: TAKE 1 TABLET BY MOUTH AT BEDTIME     Not Delegated - Analgesics:  Muscle Relaxants Failed - 02/09/2022  8:53 PM      Failed - This refill cannot be delegated      Passed - Valid encounter within last 6 months    Recent Outpatient Visits           3 months ago Depression, major, recurrent, in remission ([place])   [place], [place], DO   10 months ago Need for influenza vaccination   [facility], Megan P, DO   11 months ago Depression, major, recurrent, in remission [facility])   [employer], [place], DO   1 year ago Depression, major, recurrent, in remission [facility])   [place], [place] P, DO   1 year ago Trapezius muscle spasm   [place], [place], DO       Future Appointments             In 2 months Wynetta Emery, Barb Merino, DO [employer] [employer], [facility]

## 2022-03-28 ENCOUNTER — Other Ambulatory Visit: Payer: Self-pay | Admitting: Family Medicine

## 2022-03-28 NOTE — Telephone Encounter (Signed)
Requested medication (s) are due for refill today: yes  Requested medication (s) are on the active medication list: yes  Last refill:  02/10/22 #30 0 refills  Future visit scheduled: yes in 2 weeks  Notes to clinic:  not delegated per protocol. Do you want to refill Rx?     Requested Prescriptions  Pending Prescriptions Disp Refills   cyclobenzaprine (FLEXERIL) 10 MG tablet [Pharmacy Med Name: Cyclobenzaprine HCl 10 MG Oral Tablet] 30 tablet 0    Sig: TAKE 1 TABLET BY MOUTH AT BEDTIME     Not Delegated - Analgesics:  Muscle Relaxants Failed - 03/28/2022  4:17 PM      Failed - This refill cannot be delegated      Passed - Valid encounter within last 6 months    Recent Outpatient Visits           5 months ago Depression, major, recurrent, in remission (HCC)   [facility] [place], Megan P, DO   11 months ago Need for influenza vaccination   [facility], Megan P, DO   1 year ago Depression, major, recurrent, in remission [facility])   [facility] [place], Megan P, DO   1 year ago Depression, major, recurrent, in remission [facility])   [facility] [place], Megan P, DO   2 years ago Trapezius muscle spasm   [facility] [place], [place], DO       Future Appointments             In 2 weeks [place], Oralia Rud, DO [facility], [facility]

## 2022-04-17 ENCOUNTER — Encounter: Payer: Managed Care, Other (non HMO) | Admitting: Family Medicine

## 2022-04-25 ENCOUNTER — Other Ambulatory Visit: Payer: Self-pay | Admitting: *Deleted

## 2022-04-25 ENCOUNTER — Telehealth: Payer: Self-pay | Admitting: *Deleted

## 2022-04-25 ENCOUNTER — Encounter: Payer: Self-pay | Admitting: Family Medicine

## 2022-04-25 ENCOUNTER — Ambulatory Visit (INDEPENDENT_AMBULATORY_CARE_PROVIDER_SITE_OTHER): Payer: Managed Care, Other (non HMO) | Admitting: Family Medicine

## 2022-04-25 VITALS — BP 109/74 | HR 83 | Temp 98.0°F | Ht 63.1 in | Wt 169.4 lb

## 2022-04-25 DIAGNOSIS — Z23 Encounter for immunization: Secondary | ICD-10-CM | POA: Diagnosis not present

## 2022-04-25 DIAGNOSIS — Z1211 Encounter for screening for malignant neoplasm of colon: Secondary | ICD-10-CM | POA: Diagnosis not present

## 2022-04-25 DIAGNOSIS — Z0000 Encounter for general adult medical examination without abnormal findings: Secondary | ICD-10-CM

## 2022-04-25 DIAGNOSIS — Z1231 Encounter for screening mammogram for malignant neoplasm of breast: Secondary | ICD-10-CM | POA: Diagnosis not present

## 2022-04-25 DIAGNOSIS — F1021 Alcohol dependence, in remission: Secondary | ICD-10-CM

## 2022-04-25 DIAGNOSIS — F3340 Major depressive disorder, recurrent, in remission, unspecified: Secondary | ICD-10-CM | POA: Diagnosis not present

## 2022-04-25 MED ORDER — BUPROPION HCL ER (SR) 150 MG PO TB12: 300.0000 mg | ORAL_TABLET | Freq: Two times a day (BID) | ORAL | 1 refills | Status: DC

## 2022-04-25 MED ORDER — NA SULFATE-K SULFATE-MG SULF 17.5-3.13-1.6 GM/177ML PO SOLN: 1.0000 | Freq: Once | ORAL | 0 refills | Status: AC

## 2022-04-25 NOTE — Telephone Encounter (Signed)
Gastroenterology Pre-Procedure Review  Request Date: 09/02/2022 Requesting Physician: Dr. Tobi Bastos  PATIENT REVIEW QUESTIONS: The patient responded to the following health history questions as indicated:    1. Are you having any GI issues? Yes(off and on constipation for years) 2. Do you have a personal history of Polyps? no 3. Do you have a family history of Colon Cancer or Polyps? no 4. Diabetes Mellitus? no 5. Joint replacements in the past 12 months?no 6. Major health problems in the past 3 months?no 7. Any artificial heart valves, MVP, or defibrillator?no    MEDICATIONS & ALLERGIES:    Patient reports the following regarding taking any anticoagulation/antiplatelet therapy:   Plavix, Coumadin, Eliquis, Xarelto, Lovenox, Pradaxa, Brilinta, or Effient? no Aspirin? no  Patient confirms/reports the following medications:  Current Outpatient Medications  Medication Sig Dispense Refill   buPROPion (WELLBUTRIN SR) 150 MG 12 hr tablet Take 2 tablets (300 mg total) by mouth 2 (two) times daily. 360 tablet 1   busPIRone (BUSPAR) 5 MG tablet TAKE 1 TO 2 TABLETS BY MOUTH THREE TIMES DAILY 270 tablet 1   citalopram (CELEXA) 40 MG tablet Take 1 tablet (40 mg total) by mouth daily. 90 tablet 1   cyclobenzaprine (FLEXERIL) 10 MG tablet TAKE 1 TABLET BY MOUTH AT BEDTIME 30 tablet 0   levonorgestrel-ethinyl estradiol (SEASONALE,INTROVALE,JOLESSA) 0.15-0.03 MG tablet Take 1 tablet by mouth daily.     montelukast (SINGULAIR) 10 MG tablet Take 1 tablet (10 mg total) by mouth at bedtime. Take around the change in season to prevent sinus infections 90 tablet 3   valACYclovir (VALTREX) 1000 MG tablet Take 1 tablet (1,000 mg total) by mouth daily. 90 tablet 3   No current facility-administered medications for this visit.    Patient confirms/reports the following allergies:  Allergies  Allergen Reactions   Percocet [Oxycodone-Acetaminophen] Nausea And Vomiting    No orders of the defined types were  placed in this encounter.   AUTHORIZATION INFORMATION Primary Insurance: 1D#: Group #:  Secondary Insurance: 1D#: Group #:  SCHEDULE INFORMATION: Date: 09/02/2022 Time: Location: [facility]

## 2022-04-25 NOTE — Progress Notes (Signed)
BP 109/74   Pulse 83   Temp 98 F (36.7 C) (Oral)   Ht 5' 3.1" (1.603 m)   Wt 169 lb 6.4 oz (76.8 kg)   LMP  (LMP Unknown)   SpO2 98%   BMI 29.91 kg/m    Subjective:    Patient ID: Sandra Wright, female    DOB: 12-Dec-1975, 46 y.o.   MRN: [id number]  HPI: Sandra Wright is a 46 y.o. female presenting on 04/25/2022 for comprehensive medical examination. Current medical complaints include:  DEPRESSION Mood status: stable Satisfied with current treatment?: yes Symptom severity: mild  Duration of current treatment : chronic Side effects: no Medication compliance: excellent compliance Psychotherapy/counseling: no  Previous psychiatric medications: wellbutrin, celexa Depressed mood: no Anxious mood: yes Anhedonia: no Significant weight loss or gain: yes Insomnia: no  Fatigue: yes Feelings of worthlessness or guilt: no Impaired concentration/indecisiveness: no Suicidal ideations: no Hopelessness: no Crying spells: no    04/25/2022    9:44 AM 10/15/2021   11:13 AM 04/15/2021   11:08 AM 03/07/2021   12:28 PM 11/16/2020    9:46 AM  Depression screen PHQ 2/9  Decreased Interest 0 0 0 1 0  Down, Depressed, Hopeless 0 0 0 1 0  PHQ - 2 Score 0 0 0 2 0  Altered sleeping 0 0 1 2 0  Tired, decreased energy 1 0 0 2 0  Change in appetite 0 0 0 2 0  Feeling bad or failure about yourself  0 0 0 1 0  Trouble concentrating 0 0 0 0 0  Moving slowly or fidgety/restless 0 0 0 0 0  Suicidal thoughts 0 0 0 0 0  PHQ-9 Score 1 0 1 9 0  Difficult doing work/chores Somewhat difficult Not difficult at all  Very difficult Not difficult at all   She currently lives with: husband Menopausal Symptoms: no  Depression Screen done today and results listed below:     04/25/2022    9:44 AM 10/15/2021   11:13 AM 04/15/2021   11:08 AM 03/07/2021   12:28 PM 11/16/2020    9:46 AM  Depression screen PHQ 2/9  Decreased Interest 0 0 0 1 0  Down, Depressed, Hopeless 0 0 0 1 0  PHQ - 2 Score 0 0 0 2  0  Altered sleeping 0 0 1 2 0  Tired, decreased energy 1 0 0 2 0  Change in appetite 0 0 0 2 0  Feeling bad or failure about yourself  0 0 0 1 0  Trouble concentrating 0 0 0 0 0  Moving slowly or fidgety/restless 0 0 0 0 0  Suicidal thoughts 0 0 0 0 0  PHQ-9 Score 1 0 1 9 0  Difficult doing work/chores Somewhat difficult Not difficult at all  Very difficult Not difficult at all    Past Medical History:  Past Medical History:  Diagnosis Date   Degenerative cervical disc    Endometriosis     Surgical History:  Past Surgical History:  Procedure Laterality Date   AUGMENTATION MAMMAPLASTY  2001   BREAST ENHANCEMENT SURGERY     HERNIA REPAIR  09/30/2021   abdominal hernia   RIGHT OOPHORECTOMY Right     Medications:  Current Outpatient Medications on File Prior to Visit  Medication Sig   busPIRone (BUSPAR) 5 MG tablet TAKE 1 TO 2 TABLETS BY MOUTH THREE TIMES DAILY   citalopram (CELEXA) 40 MG tablet Take 1 tablet (40 mg total) by mouth  daily.   cyclobenzaprine (FLEXERIL) 10 MG tablet TAKE 1 TABLET BY MOUTH AT BEDTIME   levonorgestrel-ethinyl estradiol (SEASONALE,INTROVALE,JOLESSA) 0.15-0.03 MG tablet Take 1 tablet by mouth daily.   montelukast (SINGULAIR) 10 MG tablet Take 1 tablet (10 mg total) by mouth at bedtime. Take around the change in season to prevent sinus infections   valACYclovir (VALTREX) 1000 MG tablet Take 1 tablet (1,000 mg total) by mouth daily.   No current facility-administered medications on file prior to visit.    Allergies:  Allergies  Allergen Reactions   Percocet [Oxycodone-Acetaminophen] Nausea And Vomiting    Social History:  Social History   Socioeconomic History   Marital status: Married    Spouse name: Not on file   Number of children: Not on file   Years of education: Not on file   Highest education level: Not on file  Occupational History   Not on file  Tobacco Use   Smoking status: Former    Packs/day: 1.00    Years: 15.00    Total  pack years: 15.00    Types: Cigarettes    Quit date: 05/19/2002    Years since quitting: 19.9   Smokeless tobacco: Never  Vaping Use   Vaping Use: Former  Substance and Sexual Activity   Alcohol use: Not Currently   Drug use: No   Sexual activity: Yes    Birth control/protection: Pill  Other Topics Concern   Not on file  Social History Narrative   Not on file   Social Determinants of Health   Financial Resource Strain: Not on file  Food Insecurity: Not on file  Transportation Needs: Not on file  Physical Activity: Not on file  Stress: Not on file  Social Connections: Not on file  Intimate Partner Violence: Not on file   Social History   Tobacco Use  Smoking Status Former   Packs/day: 1.00   Years: 15.00   Total pack years: 15.00   Types: Cigarettes   Quit date: 05/19/2002   Years since quitting: 19.9  Smokeless Tobacco Never   Social History   Substance and Sexual Activity  Alcohol Use Not Currently    Family History:  Family History  Problem Relation Age of Onset   Arthritis Mother    Diabetes Mother    Hypertension Mother    Lung cancer Mother    Cancer - Lung Mother    Breast cancer Paternal Grandmother    Bipolar disorder Paternal Grandmother    Cancer Neg Hx    COPD Neg Hx    Heart disease Neg Hx    Stroke Neg Hx     Past medical history, surgical history, medications, allergies, family history and social history reviewed with patient today and changes made to appropriate areas of the chart.   Review of Systems  Constitutional:  Positive for diaphoresis. Negative for chills, fever, malaise/fatigue and weight loss.  HENT: Negative.    Eyes: Negative.   Respiratory: Negative.    Cardiovascular: Negative.   Gastrointestinal:  Positive for heartburn. Negative for abdominal pain, blood in stool, constipation, diarrhea, melena, nausea and vomiting.  Genitourinary: Negative.   Musculoskeletal: Negative.   Skin: Negative.   Neurological: Negative.    Endo/Heme/Allergies: Negative.   Psychiatric/Behavioral:  Negative for depression, hallucinations, memory loss, substance abuse and suicidal ideas. The patient is nervous/anxious. The patient does not have insomnia.    All other ROS negative except what is listed above and in the HPI.      Objective:  BP 109/74   Pulse 83   Temp 98 F (36.7 C) (Oral)   Ht 5' 3.1" (1.603 m)   Wt 169 lb 6.4 oz (76.8 kg)   LMP  (LMP Unknown)   SpO2 98%   BMI 29.91 kg/m   Wt Readings from Last 3 Encounters:  04/25/22 169 lb 6.4 oz (76.8 kg)  10/15/21 164 lb 3.2 oz (74.5 kg)  04/15/21 164 lb 12.8 oz (74.8 kg)    Physical Exam Vitals and nursing note reviewed.  Constitutional:      General: She is not in acute distress.    Appearance: Normal appearance. She is not ill-appearing, toxic-appearing or diaphoretic.  HENT:     Head: Normocephalic and atraumatic.     Right Ear: Tympanic membrane, ear canal and external ear normal. There is no impacted cerumen.     Left Ear: Tympanic membrane, ear canal and external ear normal. There is no impacted cerumen.     Nose: Nose normal. No congestion or rhinorrhea.     Mouth/Throat:     Mouth: Mucous membranes are moist.     Pharynx: Oropharynx is clear. No oropharyngeal exudate or posterior oropharyngeal erythema.  Eyes:     General: No scleral icterus.       Right eye: No discharge.        Left eye: No discharge.     Extraocular Movements: Extraocular movements intact.     Conjunctiva/sclera: Conjunctivae normal.     Pupils: Pupils are equal, round, and reactive to light.  Neck:     Vascular: No carotid bruit.  Cardiovascular:     Rate and Rhythm: Normal rate and regular rhythm.     Pulses: Normal pulses.     Heart sounds: No murmur heard.    No friction rub. No gallop.  Pulmonary:     Effort: Pulmonary effort is normal. No respiratory distress.     Breath sounds: Normal breath sounds. No stridor. No wheezing, rhonchi or rales.  Chest:      Chest wall: No tenderness.  Abdominal:     General: Abdomen is flat. Bowel sounds are normal. There is no distension.     Palpations: Abdomen is soft. There is no mass.     Tenderness: There is no abdominal tenderness. There is no right CVA tenderness, left CVA tenderness, guarding or rebound.     Hernia: No hernia is present.  Genitourinary:    Comments: Breast and pelvic exams deferred with shared decision making Musculoskeletal:        General: No swelling, tenderness, deformity or signs of injury.     Cervical back: Normal range of motion and neck supple. No rigidity. No muscular tenderness.     Right lower leg: No edema.     Left lower leg: No edema.  Lymphadenopathy:     Cervical: No cervical adenopathy.  Skin:    General: Skin is warm and dry.     Capillary Refill: Capillary refill takes less than 2 seconds.     Coloration: Skin is not jaundiced or pale.     Findings: No bruising, erythema, lesion or rash.  Neurological:     General: No focal deficit present.     Mental Status: She is alert and oriented to person, place, and time. Mental status is at baseline.     Cranial Nerves: No cranial nerve deficit.     Sensory: No sensory deficit.     Motor: No weakness.     Coordination: Coordination normal.  Gait: Gait normal.     Deep Tendon Reflexes: Reflexes normal.  Psychiatric:        Mood and Affect: Mood normal.        Behavior: Behavior normal.        Thought Content: Thought content normal.        Judgment: Judgment normal.     Results for orders placed or performed in visit on 11/29/19  Microscopic Examination   Urine  Result Value Ref Range   WBC, UA 6-10 (A) 0 - 5 /hpf   RBC, Urine 3-10 (A) 0 - 2 /hpf   Epithelial Cells (non renal) 0-10 0 - 10 /hpf   Bacteria, UA Many (A) None seen/Few  Hepatitis C antibody  Result Value Ref Range   Hep C Virus Ab <0.1 0.0 - 0.9 s/co ratio  CBC with Differential/Platelet  Result Value Ref Range   WBC 8.9 3.4 - 10.8  x10E3/uL   RBC 4.25 3.77 - 5.28 x10E6/uL   Hemoglobin 13.6 11.1 - 15.9 g/dL   Hematocrit 81.2 75.1 - 46.6 %   MCV 97 79 - 97 fL   MCH 32.0 26.6 - 33.0 pg   MCHC 33.2 31.5 - 35.7 g/dL   RDW 70.0 17.4 - 94.4 %   Platelets 436 150 - 450 x10E3/uL   Neutrophils 55 Not Estab. %   Lymphs 32 Not Estab. %   Monocytes 7 Not Estab. %   Eos 5 Not Estab. %   Basos 1 Not Estab. %   Neutrophils Absolute 4.8 1.4 - 7.0 x10E3/uL   Lymphocytes Absolute 2.9 0.7 - 3.1 x10E3/uL   Monocytes Absolute 0.6 0.1 - 0.9 x10E3/uL   EOS (ABSOLUTE) 0.4 0.0 - 0.4 x10E3/uL   Basophils Absolute 0.1 0.0 - 0.2 x10E3/uL   Immature Granulocytes 0 Not Estab. %   Immature Grans (Abs) 0.0 0.0 - 0.1 x10E3/uL  Comprehensive metabolic panel  Result Value Ref Range   Glucose 78 65 - 99 mg/dL   BUN 8 6 - 24 mg/dL   Creatinine, Ser 9.67 0.57 - 1.00 mg/dL   GFR calc non Af Amer 86 >59 mL/min/1.73   GFR calc Af Amer 99 >59 mL/min/1.73   BUN/Creatinine Ratio 10 9 - 23   Sodium 139 134 - 144 mmol/L   Potassium 4.1 3.5 - 5.2 mmol/L   Chloride 103 96 - 106 mmol/L   CO2 23 20 - 29 mmol/L   Calcium 9.6 8.7 - 10.2 mg/dL   Total Protein 6.8 6.0 - 8.5 g/dL   Albumin 4.6 3.8 - 4.8 g/dL   Globulin, Total 2.2 1.5 - 4.5 g/dL   Albumin/Globulin Ratio 2.1 1.2 - 2.2   Bilirubin Total 0.3 0.0 - 1.2 mg/dL   Alkaline Phosphatase 76 48 - 121 IU/L   AST 26 0 - 40 IU/L   ALT 43 (H) 0 - 32 IU/L  Lipid Panel w/o Chol/HDL Ratio  Result Value Ref Range   Cholesterol, Total 219 (H) 100 - 199 mg/dL   Triglycerides 591 0 - 149 mg/dL   HDL 53 >63 mg/dL   VLDL Cholesterol Cal 19 5 - 40 mg/dL   LDL Chol Calc (NIH) 846 (H) 0 - 99 mg/dL  TSH  Result Value Ref Range   TSH 0.526 0.450 - 4.500 uIU/mL  Urinalysis, Routine w reflex microscopic  Result Value Ref Range   Specific Gravity, UA 1.010 1.005 - 1.030   pH, UA 6.5 5.0 - 7.5   Color, UA Yellow Yellow  Appearance Ur Clear Clear   Leukocytes,UA 1+ (A) Negative   Protein,UA Negative  Negative/Trace   Glucose, UA Negative Negative   Ketones, UA Negative Negative   RBC, UA 1+ (A) Negative   Bilirubin, UA Negative Negative   Urobilinogen, Ur 0.2 0.2 - 1.0 mg/dL   Nitrite, UA Negative Negative   Microscopic Examination See below:   HM PAP SMEAR  Result Value Ref Range   HM Pap smear Negative with negative HPV       Assessment & Plan:   Problem List Items Addressed This Visit       Other   Depression, major, recurrent, in remission (HCC)    Doing well. Has gained weight and is stress eating- will increase her wellbutrin and recheck 1 month. Call with any concerns. Refills given.       Relevant Medications   buPROPion (WELLBUTRIN SR) 150 MG 12 hr tablet   Recovering alcoholic (HCC)    Doing well. Continue to monitor.       Other Visit Diagnoses     Routine general medical examination at a health care facility    -  Primary   Vaccines up to date. Screening labs checked today. Pap up to date. Mammo and Colonoscopy ordered. Continue diet and exercise. Call with any concerns.   Relevant Orders   CBC with Differential/Platelet   Comprehensive metabolic panel   Lipid Panel w/o Chol/HDL Ratio   Urinalysis, Routine w reflex microscopic   TSH   Screening for colon cancer       Colonoscopy ordered.   Relevant Orders   Ambulatory referral to Gastroenterology   Encounter for screening mammogram for malignant neoplasm of breast       Mammo ordered.   Relevant Orders   MM DIGITAL SCREENING W/ IMPLANTS BILATERAL   Need for influenza vaccination       Flu shot given today.        Follow up plan: Return in about 4 weeks (around 05/23/2022).   LABORATORY TESTING:  - Pap smear: up to date  IMMUNIZATIONS:   - Tdap: Tetanus vaccination status reviewed: last tetanus booster within 10 years. - Influenza: Up to date - Pneumovax: Not applicable - Prevnar: Not applicable - COVID: Refused - HPV: Refused - Shingrix vaccine: Not  applicable  SCREENING: -Mammogram: Ordered today  - Colonoscopy: Ordered today    PATIENT COUNSELING:   Advised to take 1 mg of folate supplement per day if capable of pregnancy.   Sexuality: Discussed sexually transmitted diseases, partner selection, use of condoms, avoidance of unintended pregnancy  and contraceptive alternatives.   Advised to avoid cigarette smoking.  I discussed with the patient that most people either abstain from alcohol or drink within safe limits (<=14/week and <=4 drinks/occasion for males, <=7/weeks and <= 3 drinks/occasion for females) and that the risk for alcohol disorders and other health effects rises proportionally with the number of drinks per week and how often a drinker exceeds daily limits.  Discussed cessation/primary prevention of drug use and availability of treatment for abuse.   Diet: Encouraged to adjust caloric intake to maintain  or achieve ideal body weight, to reduce intake of dietary saturated fat and total fat, to limit sodium intake by avoiding high sodium foods and not adding table salt, and to maintain adequate dietary potassium and calcium preferably from fresh fruits, vegetables, and low-fat dairy products.    stressed the importance of regular exercise  Injury prevention: Discussed safety belts, safety helmets, smoke  detector, smoking near bedding or upholstery.   Dental health: Discussed importance of regular tooth brushing, flossing, and dental visits.    NEXT PREVENTATIVE PHYSICAL DUE IN 1 YEAR. Return in about 4 weeks (around 05/23/2022).

## 2022-04-25 NOTE — Assessment & Plan Note (Signed)
Doing well. Has gained weight and is stress eating- will increase her wellbutrin and recheck 1 month. Call with any concerns. Refills given.

## 2022-04-25 NOTE — Assessment & Plan Note (Signed)
Doing well. Continue to monitor.  

## 2022-04-26 LAB — LIPID PANEL W/O CHOL/HDL RATIO
Cholesterol, Total: 205 mg/dL — ABNORMAL HIGH (ref 100–199)
HDL: 62 mg/dL (ref >39)
LDL Chol Calc (NIH): 115 mg/dL — ABNORMAL HIGH (ref 0–99)
Triglycerides: 161 mg/dL — ABNORMAL HIGH (ref 0–149)
VLDL Cholesterol Cal: 28 mg/dL (ref 5–40)

## 2022-04-26 LAB — URINALYSIS, ROUTINE W REFLEX MICROSCOPIC
Bilirubin, UA: NEGATIVE
Glucose, UA: NEGATIVE
Ketones, UA: NEGATIVE
Nitrite, UA: NEGATIVE
Protein,UA: NEGATIVE
Specific Gravity, UA: 1.01 (ref 1.005–1.030)
Urobilinogen, Ur: 1 mg/dL (ref 0.2–1.0)
pH, UA: 7 (ref 5.0–7.5)

## 2022-04-26 LAB — CBC WITH DIFFERENTIAL/PLATELET
Basophils Absolute: 0.1 10*3/uL (ref 0.0–0.2)
Basos: 1 %
EOS (ABSOLUTE): 0.2 10*3/uL (ref 0.0–0.4)
Eos: 2 %
Hematocrit: 40.6 % (ref 34.0–46.6)
Hemoglobin: 13.8 g/dL (ref 11.1–15.9)
Immature Grans (Abs): 0.1 10*3/uL (ref 0.0–0.1)
Immature Granulocytes: 1 %
Lymphocytes Absolute: 3.1 10*3/uL (ref 0.7–3.1)
Lymphs: 30 %
MCH: 33.3 pg — ABNORMAL HIGH (ref 26.6–33.0)
MCHC: 34 g/dL (ref 31.5–35.7)
MCV: 98 fL — ABNORMAL HIGH (ref 79–97)
Monocytes Absolute: 0.5 10*3/uL (ref 0.1–0.9)
Monocytes: 5 %
Neutrophils Absolute: 6.4 10*3/uL (ref 1.4–7.0)
Neutrophils: 61 %
Platelets: 418 10*3/uL (ref 150–450)
RBC: 4.15 x10E6/uL (ref 3.77–5.28)
RDW: 11.9 % (ref 11.7–15.4)
WBC: 10.4 10*3/uL (ref 3.4–10.8)

## 2022-04-26 LAB — COMPREHENSIVE METABOLIC PANEL
ALT: 25 IU/L (ref 0–32)
AST: 21 IU/L (ref 0–40)
Albumin/Globulin Ratio: 1.8 (ref 1.2–2.2)
Albumin: 4.1 g/dL (ref 3.9–4.9)
Alkaline Phosphatase: 81 IU/L (ref 44–121)
BUN/Creatinine Ratio: 11 (ref 9–23)
BUN: 11 mg/dL (ref 6–24)
Bilirubin Total: 0.3 mg/dL (ref 0.0–1.2)
CO2: 22 mmol/L (ref 20–29)
Calcium: 9.6 mg/dL (ref 8.7–10.2)
Chloride: 103 mmol/L (ref 96–106)
Creatinine, Ser: 1 mg/dL (ref 0.57–1.00)
Globulin, Total: 2.3 g/dL (ref 1.5–4.5)
Glucose: 85 mg/dL (ref 70–99)
Potassium: 4.3 mmol/L (ref 3.5–5.2)
Sodium: 139 mmol/L (ref 134–144)
Total Protein: 6.4 g/dL (ref 6.0–8.5)
eGFR: 71 mL/min/{1.73_m2} (ref >59)

## 2022-04-26 LAB — MICROSCOPIC EXAMINATION: Casts: NONE SEEN /lpf

## 2022-04-26 LAB — TSH: TSH: 1.05 u[IU]/mL (ref 0.450–4.500)

## 2022-04-29 ENCOUNTER — Telehealth: Payer: Self-pay

## 2022-04-29 DIAGNOSIS — Z1231 Encounter for screening mammogram for malignant neoplasm of breast: Secondary | ICD-10-CM

## 2022-04-29 NOTE — Telephone Encounter (Signed)
Patient is scheduled for Mammogram on Monday January 8th at 9:00 am.

## 2022-04-29 NOTE — Telephone Encounter (Signed)
-----   Message from Dorcas Carrow, DO sent at 04/25/2022 10:11 AM EST ----- Schedule mammo

## 2022-05-07 ENCOUNTER — Ambulatory Visit: Payer: Self-pay

## 2022-05-07 ENCOUNTER — Telehealth: Payer: Managed Care, Other (non HMO) | Admitting: Family Medicine

## 2022-05-07 DIAGNOSIS — J069 Acute upper respiratory infection, unspecified: Secondary | ICD-10-CM

## 2022-05-07 MED ORDER — FLUTICASONE PROPIONATE 50 MCG/ACT NA SUSP: 2.0000 | Freq: Every day | NASAL | 0 refills | Status: DC

## 2022-05-07 MED ORDER — PSEUDOEPH-BROMPHEN-DM 30-2-10 MG/5ML PO SYRP: 5.0000 mL | ORAL_SOLUTION | Freq: Four times a day (QID) | ORAL | 0 refills | Status: DC | PRN

## 2022-05-07 NOTE — Patient Instructions (Signed)
Sandra Wright, thank you for joining Perlie Mayo, NP for today's virtual visit.  While this provider is not your primary care provider (PCP), if your PCP is located in our provider database this encounter information will be shared with them immediately following your visit.   [facility] account gives you access to today's visit and all your visits, tests, and labs performed at [facility] " click here if you don't have a [facility] account or go to [email or web address]  Consent: (Patient) Sandra Wright provided verbal consent for this virtual visit at the beginning of the encounter.  Current Medications:  Current Outpatient Medications:    brompheniramine-pseudoephedrine-DM 30-2-10 MG/5ML syrup, Take 5 mLs by mouth 4 (four) times daily as needed., Disp: 120 mL, Rfl: 0   fluticasone (FLONASE) 50 MCG/ACT nasal spray, Place 2 sprays into both nostrils daily., Disp: 16 g, Rfl: 0   buPROPion (WELLBUTRIN SR) 150 MG 12 hr tablet, Take 2 tablets (300 mg total) by mouth 2 (two) times daily., Disp: 360 tablet, Rfl: 1   busPIRone (BUSPAR) 5 MG tablet, TAKE 1 TO 2 TABLETS BY MOUTH THREE TIMES DAILY, Disp: 270 tablet, Rfl: 1   citalopram (CELEXA) 40 MG tablet, Take 1 tablet (40 mg total) by mouth daily., Disp: 90 tablet, Rfl: 1   cyclobenzaprine (FLEXERIL) 10 MG tablet, TAKE 1 TABLET BY MOUTH AT BEDTIME, Disp: 30 tablet, Rfl: 0   levonorgestrel-ethinyl estradiol (SEASONALE,INTROVALE,JOLESSA) 0.15-0.03 MG tablet, Take 1 tablet by mouth daily., Disp: , Rfl:    montelukast (SINGULAIR) 10 MG tablet, Take 1 tablet (10 mg total) by mouth at bedtime. Take around the change in season to prevent sinus infections, Disp: 90 tablet, Rfl: 3   valACYclovir (VALTREX) 1000 MG tablet, Take 1 tablet (1,000 mg total) by mouth daily., Disp: 90 tablet, Rfl: 3   Medications ordered in this encounter:  Meds ordered this encounter  Medications   fluticasone (FLONASE) 50 MCG/ACT nasal  spray    Sig: Place 2 sprays into both nostrils daily.    Dispense:  16 g    Refill:  0    Order Specific Question:   Supervising Provider    Answer:   Chase Picket [id number]   brompheniramine-pseudoephedrine-DM 30-2-10 MG/5ML syrup    Sig: Take 5 mLs by mouth 4 (four) times daily as needed.    Dispense:  120 mL    Refill:  0    Order Specific Question:   Supervising Provider    Answer:   Chase Picket [id number]     *If you need refills on other medications prior to your next appointment, please contact your pharmacy*  Follow-Up: Call back or seek an in-person evaluation if the symptoms worsen or if the condition fails to improve as anticipated.  [facility] [phone]  Other Instructions -Take meds as prescribed -Rest -Use a cool mist humidifier especially during the winter months when heat dries out the air. - Use saline nose sprays frequently to help soothe nasal passages and promote drainage. -Saline irrigations of the nose can be very helpful if done frequently.             * 4X daily for 1 week*             * Use of a nettie pot can be helpful with this.  *Follow directions with this* *Boiled or distilled water only -stay hydrated by drinking plenty of fluids - Keep thermostat turn down low  to prevent drying out sinuses - For any cough or congestion- robitussin DM or Delsym as needed - For fever or aches or pains- take tylenol or ibuprofen as directed on bottle             * for fevers greater than 101 orally you may alternate ibuprofen and tylenol every 3 hours.  If you do not improve you will need a follow up visit in person.                   If you have been instructed to have an in-person evaluation today at a local Urgent Care facility, please use the link below. It will take you to a list of all of our available [facility], including address, phone number and hours of operation. Please do not delay care.  [facility]  If you or a family member do not have a primary care provider, use the link below to schedule a visit and establish care. When you choose a [facility] primary care physician or advanced practice provider, you gain a long-term partner in health. Find a Primary Care Provider  Learn more about [facility] in-office and virtual care options: [facility] - Get Care Now

## 2022-05-07 NOTE — Telephone Encounter (Signed)
  Chief Complaint: sinus congestion  Symptoms: sinus pressure and congestion, HA, nasal drainage  Frequency: 2 days  Pertinent Negatives: fever or SOB Disposition: [] ED /[] Urgent Care (no appt availability in office) / [] Appointment(In office/virtual)/ [x]  [facility]/ [] Home Care/ [] Refused Recommended Disposition /[] [facility]/ []  Follow-up with PCP Additional Notes: pt states has bad sinus pressure and bad HA and has taken Ibuprofen and not helped. No appts until 05/09/22 so offered virtual UC and scheduled VV for today at 1430.   Summary: congestion / rx req   The patient shares that they have experienced headache, congestion and sinus drainage issues  The patient first began to experience their symptoms 05/06/22  The patient would like to be prescribed something for their discomfort  Please contact further when possible         Reason for Disposition  [1] SEVERE pain AND [2] not improved 2 hours after pain medicine  Answer Assessment - Initial Assessment Questions 1. LOCATION: "Where does it hurt?"      head 2. ONSET: "When did the sinus pain start?"  (e.g., hours, days)      2 days  3. SEVERITY: "How bad is the pain?"   (Scale 1-10; mild, moderate or severe)   - MILD (1-3): doesn't interfere with normal activities    - MODERATE (4-7): interferes with normal activities (e.g., work or school) or awakens from sleep   - SEVERE (8-10): excruciating pain and patient unable to do any normal activities        Moderate  5. NASAL CONGESTION: "Is the nose blocked?" If Yes, ask: "Can you open it or must you breathe through your mouth?"     yes 6. NASAL DISCHARGE: "Do you have discharge from your nose?" If so ask, "What color?"     Yes  7. FEVER: "Do you have a fever?" If Yes, ask: "What is it, how was it measured, and when did it start?"      no 8. OTHER SYMPTOMS: "Do you have any other symptoms?" (e.g., sore throat, cough, earache, difficulty breathing)      HA, congestion, nasal drainage  Protocols used: Sinus Pain or Congestion-A-AH

## 2022-05-07 NOTE — Progress Notes (Signed)
Virtual Visit Consent   Sandra Wright, you are scheduled for a virtual visit with a [facility] provider today. Just as with appointments in the office, your consent must be obtained to participate. Your consent will be active for this visit and any virtual visit you may have with one of our providers in the next 365 days. If you have a MyChart account, a copy of this consent can be sent to you electronically.  As this is a virtual visit, video technology does not allow for your provider to perform a traditional examination. This may limit your provider's ability to fully assess your condition. If your provider identifies any concerns that need to be evaluated in person or the need to arrange testing (such as labs, EKG, etc.), we will make arrangements to do so. Although advances in technology are sophisticated, we cannot ensure that it will always work on either your end or our end. If the connection with a video visit is poor, the visit may have to be switched to a telephone visit. With either a video or telephone visit, we are not always able to ensure that we have a secure connection.  By engaging in this virtual visit, you consent to the provision of healthcare and authorize for your insurance to be billed (if applicable) for the services provided during this visit. Depending on your insurance coverage, you may receive a charge related to this service.  I need to obtain your verbal consent now. Are you willing to proceed with your visit today? Sandra Wright has provided verbal consent on 05/07/2022 for a virtual visit (video or telephone). Freddy Finner, NP  Date: 05/07/2022 2:28 PM  Virtual Visit via Video Note   I, Freddy Finner, connected with  Sandra Wright  ([id number], June 13, 1975) on 05/07/22 at  2:30 PM EST by a video-enabled telemedicine application and verified that I am speaking with the correct person using two identifiers.  Location: Patient: Virtual Visit Location Patient:  Home Provider: Virtual Visit Location Provider: Home Office   I discussed the limitations of evaluation and management by telemedicine and the availability of in person appointments. The patient expressed understanding and agreed to proceed.    History of Present Illness: Sandra Wright is a 46 y.o. who identifies as a female who was assigned female at birth, and is being seen today for sinus congestion and headache.  Nausea and increased mucus. Son has similar symptoms. Denies body aches, fevers, chest pain or shortness of breath   Problems:  Patient Active Problem List   Diagnosis Date Noted   Genital herpes simplex 09/18/2019   Recovering alcoholic (HCC) 09/18/2016   Social anxiety disorder 09/18/2016   Depression, major, recurrent, in remission (HCC) 02/18/2016   Deviated nasal septum 03/06/2015   Eosinophilia 03/06/2015   Allergic rhinitis 03/06/2015   Allergic conjunctivitis 03/06/2015   Degenerative cervical disc     Allergies:  Allergies  Allergen Reactions   Percocet [Oxycodone-Acetaminophen] Nausea And Vomiting   Medications:  Current Outpatient Medications:    buPROPion (WELLBUTRIN SR) 150 MG 12 hr tablet, Take 2 tablets (300 mg total) by mouth 2 (two) times daily., Disp: 360 tablet, Rfl: 1   busPIRone (BUSPAR) 5 MG tablet, TAKE 1 TO 2 TABLETS BY MOUTH THREE TIMES DAILY, Disp: 270 tablet, Rfl: 1   citalopram (CELEXA) 40 MG tablet, Take 1 tablet (40 mg total) by mouth daily., Disp: 90 tablet, Rfl: 1   cyclobenzaprine (FLEXERIL) 10 MG tablet, TAKE 1  TABLET BY MOUTH AT BEDTIME, Disp: 30 tablet, Rfl: 0   levonorgestrel-ethinyl estradiol (SEASONALE,INTROVALE,JOLESSA) 0.15-0.03 MG tablet, Take 1 tablet by mouth daily., Disp: , Rfl:    montelukast (SINGULAIR) 10 MG tablet, Take 1 tablet (10 mg total) by mouth at bedtime. Take around the change in season to prevent sinus infections, Disp: 90 tablet, Rfl: 3   valACYclovir (VALTREX) 1000 MG tablet, Take 1 tablet (1,000 mg total)  by mouth daily., Disp: 90 tablet, Rfl: 3  Observations/Objective: Patient is well-developed, well-nourished in no acute distress.  Resting comfortably  at home.  Head is normocephalic, atraumatic.  No labored breathing.  Speech is clear and coherent with logical content.  Patient is alert and oriented at baseline.    Assessment and Plan:   1. Viral URI - fluticasone (FLONASE) 50 MCG/ACT nasal spray; Place 2 sprays into both nostrils daily.  Dispense: 16 g; Refill: 0 - brompheniramine-pseudoephedrine-DM 30-2-10 MG/5ML syrup; Take 5 mLs by mouth 4 (four) times daily as needed.  Dispense: 120 mL; Refill: 0   -Take meds as prescribed -Rest -Use a cool mist humidifier especially during the winter months when heat dries out the air. - Use saline nose sprays frequently to help soothe nasal passages and promote drainage. -Saline irrigations of the nose can be very helpful if done frequently.             * 4X daily for 1 week*             * Use of a nettie pot can be helpful with this.  *Follow directions with this* *Boiled or distilled water only -stay hydrated by drinking plenty of fluids - Keep thermostat turn down low to prevent drying out sinuses - For any cough or congestion- robitussin DM or Delsym as needed - For fever or aches or pains- take tylenol or ibuprofen as directed on bottle             * for fevers greater than 101 orally you may alternate ibuprofen and tylenol every 3 hours.  If you do not improve you will need a follow up visit in person.                Reviewed side effects, risks and benefits of medication.    Patient acknowledged agreement and understanding of the plan.   Past Medical, Surgical, Social History, Allergies, and Medications have been Reviewed.    Follow Up Instructions: I discussed the assessment and treatment plan with the patient. The patient was provided an opportunity to ask questions and all were answered. The patient agreed with the  plan and demonstrated an understanding of the instructions.  A copy of instructions were sent to the patient via MyChart unless otherwise noted below.     The patient was advised to call back or seek an in-person evaluation if the symptoms worsen or if the condition fails to improve as anticipated.  Time:  I spent 10 minutes with the patient via telehealth technology discussing the above problems/concerns.    Freddy Finner, NP

## 2022-05-09 ENCOUNTER — Other Ambulatory Visit: Payer: Self-pay | Admitting: Family Medicine

## 2022-05-09 NOTE — Telephone Encounter (Signed)
Requested medication (s) are due for refill today: routing for approval  Requested medication (s) are on the active medication list: yes  Last refill:  03/31/22  Future visit scheduled: yes  Notes to clinic:  Unable to refill per protocol, cannot delegate.      Requested Prescriptions  Pending Prescriptions Disp Refills   cyclobenzaprine (FLEXERIL) 10 MG tablet [Pharmacy Med Name: Cyclobenzaprine HCl 10 MG Oral Tablet] 30 tablet 0    Sig: TAKE 1 TABLET BY MOUTH AT BEDTIME     Not Delegated - Analgesics:  Muscle Relaxants Failed - 05/09/2022  1:40 PM      Failed - This refill cannot be delegated      Passed - Valid encounter within last 6 months    Recent Outpatient Visits           2 weeks ago Routine general medical examination at a health care facility   [facility], [place] P, DO   6 months ago Depression, major, recurrent, in remission [facility])   [facility] [place], Megan P, DO   1 year ago Need for influenza vaccination   [facility] [place], Megan P, DO   1 year ago Depression, major, recurrent, in remission [facility])   [facility] [place], Megan P, DO   1 year ago Depression, major, recurrent, in remission [facility])   [facility] [place], Oralia Rud, DO       Future Appointments             In 2 weeks [place] Benes, Oralia Rud, DO [facility], [facility]

## 2022-05-23 ENCOUNTER — Ambulatory Visit: Payer: Managed Care, Other (non HMO) | Admitting: Family Medicine

## 2022-05-26 ENCOUNTER — Ambulatory Visit
Admission: RE | Admit: 2022-05-26 | Discharge: 2022-05-26 | Disposition: A | Discharge status: Home / Self Care | Payer: Managed Care, Other (non HMO) | Source: Ambulatory Visit | Attending: Family Medicine | Admitting: Family Medicine

## 2022-05-26 DIAGNOSIS — Z1231 Encounter for screening mammogram for malignant neoplasm of breast: Secondary | ICD-10-CM | POA: Diagnosis present

## 2022-06-04 ENCOUNTER — Ambulatory Visit: Payer: Managed Care, Other (non HMO) | Admitting: Family Medicine

## 2022-06-04 ENCOUNTER — Encounter: Payer: Self-pay | Admitting: Family Medicine

## 2022-06-04 VITALS — BP 120/77 | HR 92 | Temp 98.7°F | Ht 63.1 in | Wt 168.2 lb

## 2022-06-04 DIAGNOSIS — F3340 Major depressive disorder, recurrent, in remission, unspecified: Secondary | ICD-10-CM

## 2022-06-04 MED ORDER — BUPROPION HCL ER (SR) 150 MG PO TB12: 150.0000 mg | ORAL_TABLET | Freq: Two times a day (BID) | ORAL | 1 refills | Status: DC

## 2022-06-04 NOTE — Assessment & Plan Note (Signed)
Didn't like the increased dose of wellbutrin. Will go back to 150mg  and recheck 5 months. Call with any concerns.

## 2022-06-04 NOTE — Progress Notes (Signed)
BP 120/77   Pulse 92   Temp 98.7 F (37.1 C) (Oral)   Ht 5' 3.1" (1.603 m)   Wt 168 lb 3.2 oz (76.3 kg)   SpO2 98%   BMI 29.70 kg/m    Subjective:    Patient ID: Sandra Wright, female    DOB: 11/16/1975, 47 y.o.   MRN: [id number]  HPI: Sandra Wright is a 47 y.o. female  Chief Complaint  Patient presents with   Depression    Patient says she does not think she is liking her dose change and says she wants to discuss possibly going back to her prior dose.    DEPRESSION- has been feeling more groggy and not like herself Mood status: controlled Satisfied with current treatment?: no Symptom severity: mild  Duration of current treatment : chronic Side effects: no Medication compliance: excellent compliance Psychotherapy/counseling: no  Previous psychiatric medications: wellbutrin, celexa Depressed mood: no Anxious mood: no Anhedonia: no Significant weight loss or gain: no Insomnia: no  Fatigue: no Feelings of worthlessness or guilt: no Impaired concentration/indecisiveness: no Suicidal ideations: no Hopelessness: no Crying spells: no    06/04/2022   10:41 AM 04/25/2022    9:44 AM 10/15/2021   11:13 AM 04/15/2021   11:08 AM 03/07/2021   12:28 PM  Depression screen PHQ 2/9  Decreased Interest 0 0 0 0 1  Down, Depressed, Hopeless 0 0 0 0 1  PHQ - 2 Score 0 0 0 0 2  Altered sleeping 0 0 0 1 2  Tired, decreased energy 0 1 0 0 2  Change in appetite 1 0 0 0 2  Feeling bad or failure about yourself  0 0 0 0 1  Trouble concentrating 0 0 0 0 0  Moving slowly or fidgety/restless 0 0 0 0 0  Suicidal thoughts 0 0 0 0 0  PHQ-9 Score 1 1 0 1 9  Difficult doing work/chores Not difficult at all Somewhat difficult Not difficult at all  Very difficult     Relevant past medical, surgical, family and social history reviewed and updated as indicated. Interim medical history since our last visit reviewed. Allergies and medications reviewed and updated.  Review of Systems   Constitutional: Negative.   Respiratory: Negative.    Cardiovascular: Negative.   Musculoskeletal: Negative.   Neurological: Negative.   Psychiatric/Behavioral: Negative.      Per HPI unless specifically indicated above     Objective:    BP 120/77   Pulse 92   Temp 98.7 F (37.1 C) (Oral)   Ht 5' 3.1" (1.603 m)   Wt 168 lb 3.2 oz (76.3 kg)   SpO2 98%   BMI 29.70 kg/m   Wt Readings from Last 3 Encounters:  06/04/22 168 lb 3.2 oz (76.3 kg)  04/25/22 169 lb 6.4 oz (76.8 kg)  10/15/21 164 lb 3.2 oz (74.5 kg)    Physical Exam Vitals and nursing note reviewed.  Constitutional:      General: She is not in acute distress.    Appearance: Normal appearance. She is not ill-appearing, toxic-appearing or diaphoretic.  HENT:     Head: Normocephalic and atraumatic.     Right Ear: External ear normal.     Left Ear: External ear normal.     Nose: Nose normal.     Mouth/Throat:     Mouth: Mucous membranes are moist.     Pharynx: Oropharynx is clear.  Eyes:     General: No scleral icterus.  Right eye: No discharge.        Left eye: No discharge.     Extraocular Movements: Extraocular movements intact.     Conjunctiva/sclera: Conjunctivae normal.     Pupils: Pupils are equal, round, and reactive to light.  Cardiovascular:     Rate and Rhythm: Normal rate and regular rhythm.     Pulses: Normal pulses.     Heart sounds: Normal heart sounds. No murmur heard.    No friction rub. No gallop.  Pulmonary:     Effort: Pulmonary effort is normal. No respiratory distress.     Breath sounds: Normal breath sounds. No stridor. No wheezing, rhonchi or rales.  Chest:     Chest wall: No tenderness.  Musculoskeletal:        General: Normal range of motion.     Cervical back: Normal range of motion and neck supple.  Skin:    General: Skin is warm and dry.     Capillary Refill: Capillary refill takes less than 2 seconds.     Coloration: Skin is not jaundiced or pale.     Findings: No  bruising, erythema, lesion or rash.  Neurological:     General: No focal deficit present.     Mental Status: She is alert and oriented to person, place, and time. Mental status is at baseline.  Psychiatric:        Mood and Affect: Mood normal.        Behavior: Behavior normal.        Thought Content: Thought content normal.        Judgment: Judgment normal.     Results for orders placed or performed in visit on 04/25/22  Microscopic Examination  Result Value Ref Range   WBC, UA 0-5 0 - 5 /hpf   RBC, Urine 3-10 (A) 0 - 2 /hpf   Epithelial Cells (non renal) 0-10 0 - 10 /hpf   Casts None seen None seen /lpf   Bacteria, UA Few None seen/Few  CBC with Differential/Platelet  Result Value Ref Range   WBC 10.4 3.4 - 10.8 x10E3/uL   RBC 4.15 3.77 - 5.28 x10E6/uL   Hemoglobin 13.8 11.1 - 15.9 g/dL   Hematocrit 38.1 82.9 - 46.6 %   MCV 98 (H) 79 - 97 fL   MCH 33.3 (H) 26.6 - 33.0 pg   MCHC 34.0 31.5 - 35.7 g/dL   RDW 93.7 16.9 - 67.8 %   Platelets 418 150 - 450 x10E3/uL   Neutrophils 61 Not Estab. %   Lymphs 30 Not Estab. %   Monocytes 5 Not Estab. %   Eos 2 Not Estab. %   Basos 1 Not Estab. %   Neutrophils Absolute 6.4 1.4 - 7.0 x10E3/uL   Lymphocytes Absolute 3.1 0.7 - 3.1 x10E3/uL   Monocytes Absolute 0.5 0.1 - 0.9 x10E3/uL   EOS (ABSOLUTE) 0.2 0.0 - 0.4 x10E3/uL   Basophils Absolute 0.1 0.0 - 0.2 x10E3/uL   Immature Granulocytes 1 Not Estab. %   Immature Grans (Abs) 0.1 0.0 - 0.1 x10E3/uL  Comprehensive metabolic panel  Result Value Ref Range   Glucose 85 70 - 99 mg/dL   BUN 11 6 - 24 mg/dL   Creatinine, Ser 9.38 0.57 - 1.00 mg/dL   eGFR 71 >10 FB/PZW/2.58   BUN/Creatinine Ratio 11 9 - 23   Sodium 139 134 - 144 mmol/L   Potassium 4.3 3.5 - 5.2 mmol/L   Chloride 103 96 - 106 mmol/L   CO2  22 20 - 29 mmol/L   Calcium 9.6 8.7 - 10.2 mg/dL   Total Protein 6.4 6.0 - 8.5 g/dL   Albumin 4.1 3.9 - 4.9 g/dL   Globulin, Total 2.3 1.5 - 4.5 g/dL   Albumin/Globulin Ratio 1.8 1.2  - 2.2   Bilirubin Total 0.3 0.0 - 1.2 mg/dL   Alkaline Phosphatase 81 44 - 121 IU/L   AST 21 0 - 40 IU/L   ALT 25 0 - 32 IU/L  Lipid Panel w/o Chol/HDL Ratio  Result Value Ref Range   Cholesterol, Total 205 (H) 100 - 199 mg/dL   Triglycerides 161 (H) 0 - 149 mg/dL   HDL 62 >39 mg/dL   VLDL Cholesterol Cal 28 5 - 40 mg/dL   LDL Chol Calc (NIH) 115 (H) 0 - 99 mg/dL  Urinalysis, Routine w reflex microscopic  Result Value Ref Range   Specific Gravity, UA 1.010 1.005 - 1.030   pH, UA 7.0 5.0 - 7.5   Color, UA Yellow Yellow   Appearance Ur Clear Clear   Leukocytes,UA 1+ (A) Negative   Protein,UA Negative Negative/Trace   Glucose, UA Negative Negative   Ketones, UA Negative Negative   RBC, UA 1+ (A) Negative   Bilirubin, UA Negative Negative   Urobilinogen, Ur 1.0 0.2 - 1.0 mg/dL   Nitrite, UA Negative Negative   Microscopic Examination See below:   TSH  Result Value Ref Range   TSH 1.050 0.450 - 4.500 uIU/mL      Assessment & Plan:   Problem List Items Addressed This Visit       Other   Depression, major, recurrent, in remission ([place]) - Primary    Didn't like the increased dose of wellbutrin. Will go back to 150mg  and recheck 5 months. Call with any concerns.       Relevant Medications   buPROPion (WELLBUTRIN SR) 150 MG 12 hr tablet     Follow up plan: Return in about 5 months (around 11/03/2022).

## 2022-06-12 ENCOUNTER — Encounter: Payer: Self-pay | Admitting: Family Medicine

## 2022-07-03 ENCOUNTER — Other Ambulatory Visit: Payer: Self-pay | Admitting: Family Medicine

## 2022-07-03 NOTE — Telephone Encounter (Signed)
Requested Prescriptions  Pending Prescriptions Disp Refills   buPROPion (WELLBUTRIN SR) 150 MG 12 hr tablet [Pharmacy Med Name: buPROPion HCl ER (SR) 150 MG Oral Tablet Extended Release 12 Hour] 180 tablet 1    Sig: Take 1 tablet by mouth twice daily     Psychiatry: Antidepressants - bupropion Passed - 07/03/2022  7:24 AM      Passed - Cr in normal range and within 360 days    Creatinine, Ser  Date Value Ref Range Status  04/25/2022 1.00 0.57 - 1.00 mg/dL Final         Passed - AST in normal range and within 360 days    AST  Date Value Ref Range Status  04/25/2022 21 0 - 40 IU/L Final         Passed - ALT in normal range and within 360 days    ALT  Date Value Ref Range Status  04/25/2022 25 0 - 32 IU/L Final         Passed - Completed PHQ-2 or PHQ-9 in the last 360 days      Passed - Last BP in normal range    BP Readings from Last 1 Encounters:  06/04/22 120/77         Passed - Valid encounter within last 6 months    Recent Outpatient Visits           4 weeks ago Depression, major, recurrent, in remission ([place])   [place], Megan P, DO   2 months ago Routine general medical examination at a health care facility   [facility], [place] P, DO   8 months ago Depression, major, recurrent, in remission [facility])   [place], Megan P, DO   1 year ago Need for influenza vaccination   [place], Megan P, DO   1 year ago Depression, major, recurrent, in remission [facility])   [place], [place], DO       Future Appointments             In 4 months Wynetta Emery, Barb Merino, DO [place], [facility]             cyclobenzaprine (FLEXERIL) 10 MG tablet [Pharmacy Med Name: Cyclobenzaprine HCl 10 MG Oral Tablet] 30 tablet 0    Sig: TAKE 1 TABLET BY MOUTH AT BEDTIME     Not Delegated - Analgesics:   Muscle Relaxants Failed - 07/03/2022  7:24 AM      Failed - This refill cannot be delegated      Passed - Valid encounter within last 6 months    Recent Outpatient Visits           4 weeks ago Depression, major, recurrent, in remission [facility])   [place], Megan P, DO   2 months ago Routine general medical examination at a health care facility   [place] P, DO   8 months ago Depression, major, recurrent, in remission [facility])   [place], Megan P, DO   1 year ago Need for influenza vaccination   [place], DO   1 year ago Depression, major, recurrent, in remission [facility])   [place], Megan P, DO       Future Appointments  In 4 months Wynetta Emery, Barb Merino, DO [place], [facility]

## 2022-07-03 NOTE — Telephone Encounter (Signed)
Requested medications are due for refill today.  yes  Requested medications are on the active medications list.  yes  Last refill. 05/09/2022 #30 0 rf  Future visit scheduled.   yes  Notes to clinic.  Refill not delegated.    Requested Prescriptions  Pending Prescriptions Disp Refills   cyclobenzaprine (FLEXERIL) 10 MG tablet [Pharmacy Med Name: Cyclobenzaprine HCl 10 MG Oral Tablet] 30 tablet 0    Sig: TAKE 1 TABLET BY MOUTH AT BEDTIME     Not Delegated - Analgesics:  Muscle Relaxants Failed - 07/03/2022  7:24 AM      Failed - This refill cannot be delegated      Passed - Valid encounter within last 6 months    Recent Outpatient Visits           4 weeks ago Depression, major, recurrent, in remission ([place])   [place], DO   2 months ago Routine general medical examination at a health care facility   [facility], [place] P, DO   8 months ago Depression, major, recurrent, in remission [facility])   [place] P, DO   1 year ago Need for influenza vaccination   [place], DO   1 year ago Depression, major, recurrent, in remission [facility])   [place], [place], DO       Future Appointments             In 4 months [place], Barb Merino, DO [place], [facility]            Signed Prescriptions Disp Refills   buPROPion (WELLBUTRIN SR) 150 MG 12 hr tablet 180 tablet 1    Sig: Take 1 tablet by mouth twice daily     Psychiatry: Antidepressants - bupropion Passed - 07/03/2022  7:24 AM      Passed - Cr in normal range and within 360 days    Creatinine, Ser  Date Value Ref Range Status  04/25/2022 1.00 0.57 - 1.00 mg/dL Final         Passed - AST in normal range and within 360 days    AST  Date Value Ref Range Status  04/25/2022 21 0 - 40 IU/L Final         Passed -  ALT in normal range and within 360 days    ALT  Date Value Ref Range Status  04/25/2022 25 0 - 32 IU/L Final         Passed - Completed PHQ-2 or PHQ-9 in the last 360 days      Passed - Last BP in normal range    BP Readings from Last 1 Encounters:  06/04/22 120/77         Passed - Valid encounter within last 6 months    Recent Outpatient Visits           4 weeks ago Depression, major, recurrent, in remission ([place])   [place], Megan P, DO   2 months ago Routine general medical examination at a health care facility   [place] P, DO   8 months ago Depression, major, recurrent, in remission [facility])   [place], Megan P, DO   1 year ago Need for influenza vaccination   [place]  [place], Megan P, DO   1 year ago Depression, major, recurrent, in remission [facility])   [place], DO       Future Appointments             In 4 months Wynetta Emery, Barb Merino, DO [place], [facility]

## 2022-07-28 ENCOUNTER — Ambulatory Visit (INDEPENDENT_AMBULATORY_CARE_PROVIDER_SITE_OTHER): Payer: Managed Care, Other (non HMO)

## 2022-07-28 DIAGNOSIS — D125 Benign neoplasm of sigmoid colon: Secondary | ICD-10-CM | POA: Diagnosis not present

## 2022-07-28 DIAGNOSIS — D122 Benign neoplasm of ascending colon: Secondary | ICD-10-CM | POA: Diagnosis not present

## 2022-07-28 DIAGNOSIS — Z1211 Encounter for screening for malignant neoplasm of colon: Secondary | ICD-10-CM | POA: Diagnosis present

## 2022-07-28 DIAGNOSIS — K640 First degree hemorrhoids: Secondary | ICD-10-CM | POA: Diagnosis not present

## 2022-07-28 DIAGNOSIS — R195 Other fecal abnormalities: Secondary | ICD-10-CM | POA: Diagnosis not present

## 2022-07-30 ENCOUNTER — Other Ambulatory Visit: Payer: Self-pay | Admitting: Family Medicine

## 2022-07-30 NOTE — Telephone Encounter (Signed)
Requested Prescriptions  Pending Prescriptions Disp Refills   citalopram (CELEXA) 40 MG tablet [Pharmacy Med Name: Citalopram Hydrobromide 40 MG Oral Tablet] 90 tablet 1    Sig: Take 1 tablet by mouth once daily     Psychiatry:  Antidepressants - SSRI Passed - 07/30/2022  9:25 AM      Passed - Completed PHQ-2 or PHQ-9 in the last 360 days      Passed - Valid encounter within last 6 months    Recent Outpatient Visits           1 month ago Depression, major, recurrent, in remission ([place])   [place] P, DO   3 months ago Routine general medical examination at a health care facility   [facility], [place] P, DO   9 months ago Depression, major, recurrent, in remission [facility])   [place] P, DO   1 year ago Need for influenza vaccination   [place], DO   1 year ago Depression, major, recurrent, in remission [facility])   [place], Megan P, DO       Future Appointments             In 3 months Wynetta Emery, Barb Merino, DO [place], [facility]

## 2022-07-31 ENCOUNTER — Telehealth: Payer: Self-pay | Admitting: Gastroenterology

## 2022-07-31 NOTE — Telephone Encounter (Signed)
Left detail message for patient to call back. Just to confirm. It shows that patient saw [facility] last month and colonoscopy was performed on 07/30/2022.  Already called Endo Unit and spoken to [facility]. Colonoscopy have been cancelled.

## 2022-07-31 NOTE — Telephone Encounter (Signed)
Patient calling stating that she needs to cancel her colonoscopy because she has already had it.

## 2022-08-04 NOTE — Telephone Encounter (Signed)
Patient left a voicemail and confirm she already had a colonoscopy. This message left on 08/01/2022 at 1:27 pm.

## 2022-08-24 ENCOUNTER — Telehealth: Payer: Managed Care, Other (non HMO) | Admitting: Family

## 2022-08-24 DIAGNOSIS — W57XXXA Bitten or stung by nonvenomous insect and other nonvenomous arthropods, initial encounter: Secondary | ICD-10-CM

## 2022-08-24 DIAGNOSIS — S40862A Insect bite (nonvenomous) of left upper arm, initial encounter: Secondary | ICD-10-CM | POA: Diagnosis not present

## 2022-08-24 MED ORDER — DOXYCYCLINE HYCLATE 100 MG PO TABS: 100.0000 mg | ORAL_TABLET | Freq: Two times a day (BID) | ORAL | 0 refills | Status: DC

## 2022-08-24 NOTE — Progress Notes (Signed)
E-Visit for Tick Bite  Thank you for describing your tick bite, Here is how we plan to help! Based on the information that you shared with me it looks like you have A tick that bite that we will treat with a short course of doxycycline.  In most cases a tick bite is painless and does not itch.  Most tick bites in which the tick is quickly removed do not require prescriptions. Ticks can transmit several diseases if they are infected and remain attacked to your skin. Therefore the length that the tick was attached and any symptoms you have experienced after the bite are import to accurately develop your custom treatment plan. In most cases a single dose of doxycycline may prevent the development of a more serious condition.  Based on your information I have Provided a home care guide for tick bites and  instructions on when to call for help. and Your symptoms indicate that you need a longer course of antibiotics and a follow up visit with a provider. I have sent doxycycline 100 mg twice a day for 21 days to the pharmacy that you selected. You will need to schedule a follow up visit with your provider. If you do not have a primary care provider you may use our telehealth physicians on the web at [facility]  Which ticks  are associated with illness?  The Wood Tick (dog tick) is the size of a watermelon seed and can sometimes transmit Rocky Mountain spotted fever and [place] tick fever.   The Deer Tick (black-legged tick) is between the size of a poppy seed (pin head) and an apple seed, and can sometimes transmit Lyme disease.  A brown to black tick with a white splotch on its back is likely a female Amblyomma americanum ([place] Star tick). This tick has been associated with Southern Tick Associated illness ( STARI)  Lyme disease has become the most common tick-borne illness in the [place]. The risk of Lyme disease following a recognized deer tick bite is estimated to be 1%.  The majority  of cases of Lyme disease start with a bull's eye rash at the site of the tick bite. The rash can occur days to weeks (typically 7-10 days) after a tick bite. Treatment with antibiotics is indicated if this rash appears. Flu-like symptoms may accompany the rash, including: fever, chills, headaches, muscle aches, and fatigue. Removing ticks promptly may prevent tick borne disease.  What can be used to prevent Tick Bites?  Insect repellant with at leas 20% DEET. Wearing long pants with sock and shoes. Avoiding tall grass and heavily wooded areas. Checking your skin after being outdoors. Shower with a washcloth after outdoor exposures.  HOME CARE ADVICE FOR TICK BITE  Wood Tick Removal:  Use a pair of tweezers and grasp the wood tick close to the skin (on its head). Pull the wood tick straight upward without twisting or crushing it. Maintain a steady pressure until it releases its grip.   If tweezers aren't available, use fingers, a loop of thread around the jaws, or a needle between the jaws for traction.  Note: covering the tick with petroleum jelly, nail polish or rubbing alcohol doesn't work. Neither does touching the tick with a hot or cold object. Tiny Deer Tick Removal:   Needs to be scraped off with a knife blade or credit card edge. Place tick in a sealed container (e.g. glass jar, zip lock plastic bag), in case your doctor wants to see it.   Tick's Head Removal:  If the wood tick's head breaks off in the skin, it must be removed. Clean the skin. Then use a sterile needle to uncover the head and lift it out or scrape it off.  If a very small piece of the head remains, the skin will eventually slough it off. Antibiotic Ointment:  Wash the wound and your hands with soap and water after removal to prevent catching any tick disease.  Apply an over the counter antibiotic ointment (e.g. bacitracin) to the bite once. Expected Course: Tick bites normally don't itch or hurt. That's why they often  go unnoticed. Call Your Doctor If:  You can't remove the tick or the tick's head Fever, a severe head ache, or rash occur in the next 2 weeks Bite begins to look infected Lyme's disease is common in your area You have not had a tetanus in the last 10 years Your current symptoms become worse    MAKE SURE YOU  Understand these instructions. Will watch your condition. Will get help right away if you are not doing well or get worse.    Thank you for choosing an e-visit.  Your e-visit answers were reviewed by a board certified advanced clinical practitioner to complete your personal care plan. Depending upon the condition, your plan could have included both over the counter or prescription medications.  Please review your pharmacy choice. Make sure the pharmacy is open so you can pick up prescription now. If there is a problem, you may contact your provider through MyChart messaging and have the prescription routed to another pharmacy.  Your safety is important to us. If you have drug allergies check your prescription carefully.   For the next 24 hours you can use MyChart to ask questions about today's visit, request a non-urgent call back, or ask for a work or school excuse. You will get an email in the next two days asking about your experience. I hope that your e-visit has been valuable and will speed your recovery.  Approximately 5 minutes was spent documenting and reviewing patient's chart.    

## 2022-09-02 ENCOUNTER — Ambulatory Visit: Admit: 2022-09-02 | Payer: Managed Care, Other (non HMO) | Admitting: Gastroenterology

## 2022-09-02 SURGERY — COLONOSCOPY WITH PROPOFOL
Anesthesia: General

## 2022-09-26 ENCOUNTER — Other Ambulatory Visit: Payer: Self-pay | Admitting: Family Medicine

## 2022-09-26 NOTE — Telephone Encounter (Signed)
Requested medication (s) are due for refill today - yes  Requested medication (s) are on the active medication list -yes  Future visit scheduled -yes  Last refill: 07/03/22 #30  Notes to clinic: non delegated Rx  Requested Prescriptions  Pending Prescriptions Disp Refills   cyclobenzaprine (FLEXERIL) 10 MG tablet [Pharmacy Med Name: Cyclobenzaprine HCl 10 MG Oral Tablet] 30 tablet 0    Sig: TAKE 1 TABLET BY MOUTH AT BEDTIME     Not Delegated - Analgesics:  Muscle Relaxants Failed - 09/26/2022  4:13 PM      Failed - This refill cannot be delegated      Passed - Valid encounter within last 6 months    Recent Outpatient Visits           3 months ago Depression, major, recurrent, in remission (HCC)   [place] [facility] [place], Megan P, DO   5 months ago Routine general medical examination at a health care facility   [facility], Megan P, DO   11 months ago Depression, major, recurrent, in remission [facility])   [place] [facility] [place], Megan P, DO   1 year ago Need for influenza vaccination   [place] [facility] [place], Megan P, DO   1 year ago Depression, major, recurrent, in remission [facility])   [place] [facility] [place], Oralia Rud, DO       Future Appointments             In 1 month Johnson, Oralia Rud, DO [place] [facility]               Requested Prescriptions  Pending Prescriptions Disp Refills   cyclobenzaprine (FLEXERIL) 10 MG tablet [Pharmacy Med Name: Cyclobenzaprine HCl 10 MG Oral Tablet] 30 tablet 0    Sig: TAKE 1 TABLET BY MOUTH AT BEDTIME     Not Delegated - Analgesics:  Muscle Relaxants Failed - 09/26/2022  4:13 PM      Failed - This refill cannot be delegated      Passed - Valid encounter within last 6 months    Recent Outpatient Visits           3 months ago Depression, major, recurrent, in remission [facility])   [place]  [facility] [place], Megan P, DO   5 months ago Routine general medical examination at a health care facility   [facility], DO   11 months ago Depression, major, recurrent, in remission [facility])   [place] [facility] [place], Megan P, DO   1 year ago Need for influenza vaccination   [place] [facility] [place], Megan P, DO   1 year ago Depression, major, recurrent, in remission [facility])    [facility] Dorcas Carrow, DO       Future Appointments             In 1 month Johnson, Oralia Rud, DO  [facility]

## 2022-11-03 ENCOUNTER — Ambulatory Visit: Payer: Managed Care, Other (non HMO) | Admitting: Family Medicine

## 2022-11-08 ENCOUNTER — Other Ambulatory Visit: Payer: Self-pay | Admitting: Family Medicine

## 2022-11-10 NOTE — Telephone Encounter (Signed)
Requested Prescriptions  Pending Prescriptions Disp Refills   valACYclovir (VALTREX) 1000 MG tablet [Pharmacy Med Name: valACYclovir HCl 1 GM Oral Tablet] 90 tablet 0    Sig: Take 1 tablet by mouth once daily     Antimicrobials:  Antiviral Agents - Anti-Herpetic Passed - 11/08/2022  8:55 AM      Passed - Valid encounter within last 12 months    Recent Outpatient Visits           5 months ago Depression, major, recurrent, in remission [facility])   [place] [facility] [place], Megan P, DO   6 months ago Routine general medical examination at a health care facility   [facility] [place] P, DO   1 year ago Depression, major, recurrent, in remission [facility])   [place] [facility] [place], Megan P, DO   1 year ago Need for influenza vaccination   [place] [facility] [place], Megan P, DO   1 year ago Depression, major, recurrent, in remission [facility])   [place] [facility] Dorcas Carrow, DO       Future Appointments             In 1 month Johnson, Oralia Rud, DO  [facility], [facility]

## 2022-11-13 ENCOUNTER — Other Ambulatory Visit: Payer: Self-pay | Admitting: Family Medicine

## 2022-11-13 NOTE — Telephone Encounter (Signed)
Requested medication (s) are due for refill today - yes  Requested medication (s) are on the active medication list -yes  Future visit scheduled - yes  Last refill: 10/01/22 #30  Notes to clinic: non delegated Rx  Requested Prescriptions  Pending Prescriptions Disp Refills   cyclobenzaprine (FLEXERIL) 10 MG tablet [Pharmacy Med Name: Cyclobenzaprine HCl 10 MG Oral Tablet] 30 tablet 0    Sig: TAKE 1 TABLET BY MOUTH AT BEDTIME     Not Delegated - Analgesics:  Muscle Relaxants Failed - 11/13/2022  2:24 PM      Failed - This refill cannot be delegated      Passed - Valid encounter within last 6 months    Recent Outpatient Visits           5 months ago Depression, major, recurrent, in remission (HCC)   [place] [facility] [place], Megan P, DO   6 months ago Routine general medical examination at a health care facility   [facility] [place], [place] P, DO   1 year ago Depression, major, recurrent, in remission [facility])   [place] [facility] [place], Megan P, DO   1 year ago Need for influenza vaccination   [place] [facility] [place], Megan P, DO   1 year ago Depression, major, recurrent, in remission [facility])   [place] [facility] [place], Oralia Rud, DO       Future Appointments             In 1 month Johnson, Oralia Rud, DO [place] [facility]               Requested Prescriptions  Pending Prescriptions Disp Refills   cyclobenzaprine (FLEXERIL) 10 MG tablet [Pharmacy Med Name: Cyclobenzaprine HCl 10 MG Oral Tablet] 30 tablet 0    Sig: TAKE 1 TABLET BY MOUTH AT BEDTIME     Not Delegated - Analgesics:  Muscle Relaxants Failed - 11/13/2022  2:24 PM      Failed - This refill cannot be delegated      Passed - Valid encounter within last 6 months    Recent Outpatient Visits           5 months ago Depression, major, recurrent, in remission [facility])   [place] [facility] [place], Megan P, DO   6 months ago Routine general medical examination at a health care facility   [facility] [place], [place] P, DO   1 year ago Depression, major, recurrent, in remission [facility])   [place] [facility] [place], Megan P, DO   1 year ago Need for influenza vaccination   [place] [facility] [place], Megan P, DO   1 year ago Depression, major, recurrent, in remission [facility])   [place] [facility] Dorcas Carrow, DO       Future Appointments             In 1 month Johnson, Oralia Rud, DO  [facility], [facility]

## 2022-12-02 ENCOUNTER — Ambulatory Visit: Payer: Managed Care, Other (non HMO) | Admitting: Family Medicine

## 2022-12-02 ENCOUNTER — Encounter: Payer: Self-pay | Admitting: Family Medicine

## 2022-12-02 VITALS — BP 106/69 | HR 80 | Temp 97.8°F | Wt 177.2 lb

## 2022-12-02 DIAGNOSIS — F3340 Major depressive disorder, recurrent, in remission, unspecified: Secondary | ICD-10-CM

## 2022-12-02 MED ORDER — VALACYCLOVIR HCL 1 G PO TABS: 1000.0000 mg | ORAL_TABLET | Freq: Every day | ORAL | 3 refills | Status: DC

## 2022-12-02 MED ORDER — MONTELUKAST SODIUM 10 MG PO TABS: 10.0000 mg | ORAL_TABLET | Freq: Every day | ORAL | 0 refills | Status: DC

## 2022-12-02 MED ORDER — CITALOPRAM HYDROBROMIDE 20 MG PO TABS: 20.0000 mg | ORAL_TABLET | Freq: Every day | ORAL | 3 refills | Status: DC

## 2022-12-02 MED ORDER — BUPROPION HCL ER (XL) 300 MG PO TB24: 300.0000 mg | ORAL_TABLET | Freq: Every day | ORAL | 3 refills | Status: DC

## 2022-12-02 NOTE — Assessment & Plan Note (Signed)
Would like to cut down on pills. Will cut her celexa to 20mg  and increase her wellbutrin to 300mg . Recheck about 6 weeks. Call with any concerns.

## 2022-12-02 NOTE — Progress Notes (Signed)
BP 106/69   Pulse 80   Temp 97.8 F (36.6 C) (Oral)   Wt 177 lb 3.2 oz (80.4 kg)   SpO2 100%   BMI 31.29 kg/m    Subjective:    Patient ID: Sandra Wright, female    DOB: 05-31-1975, 47 y.o.   MRN: [id number]  HPI: Sandra Wright is a 47 y.o. female  Chief Complaint  Patient presents with   Depression   DEPRESSION Mood status: controlled Satisfied with current treatment?: yes Symptom severity: mild  Duration of current treatment : chronic Side effects: no Medication compliance: excellent compliance Psychotherapy/counseling: no  Previous psychiatric medications: celexa, buspar, wellbutrin Depressed mood: no Anxious mood: no Anhedonia: no Significant weight loss or gain: yes Insomnia: no  Fatigue: yes Feelings of worthlessness or guilt: no Impaired concentration/indecisiveness: no Suicidal ideations: no Hopelessness: no Crying spells: no    12/02/2022    3:29 PM 06/04/2022   10:41 AM 04/25/2022    9:44 AM 10/15/2021   11:13 AM 04/15/2021   11:08 AM  Depression screen PHQ 2/9  Decreased Interest 0 0 0 0 0  Down, Depressed, Hopeless 0 0 0 0 0  PHQ - 2 Score 0 0 0 0 0  Altered sleeping 0 0 0 0 1  Tired, decreased energy 0 0 1 0 0  Change in appetite 0 1 0 0 0  Feeling bad or failure about yourself  0 0 0 0 0  Trouble concentrating 0 0 0 0 0  Moving slowly or fidgety/restless 0 0 0 0 0  Suicidal thoughts 0 0 0 0 0  PHQ-9 Score 0 1 1 0 1  Difficult doing work/chores Not difficult at all Not difficult at all Somewhat difficult Not difficult at all      Relevant past medical, surgical, family and social history reviewed and updated as indicated. Interim medical history since our last visit reviewed. Allergies and medications reviewed and updated.  Review of Systems  Constitutional: Negative.   Respiratory: Negative.    Cardiovascular: Negative.   Gastrointestinal: Negative.   Musculoskeletal: Negative.   Neurological: Negative.   Psychiatric/Behavioral:  Negative.      Per HPI unless specifically indicated above     Objective:    BP 106/69   Pulse 80   Temp 97.8 F (36.6 C) (Oral)   Wt 177 lb 3.2 oz (80.4 kg)   SpO2 100%   BMI 31.29 kg/m   Wt Readings from Last 3 Encounters:  12/02/22 177 lb 3.2 oz (80.4 kg)  06/04/22 168 lb 3.2 oz (76.3 kg)  04/25/22 169 lb 6.4 oz (76.8 kg)    Physical Exam Vitals and nursing note reviewed.  Constitutional:      General: She is not in acute distress.    Appearance: Normal appearance. She is not ill-appearing, toxic-appearing or diaphoretic.  HENT:     Head: Normocephalic and atraumatic.     Right Ear: External ear normal.     Left Ear: External ear normal.     Nose: Nose normal.     Mouth/Throat:     Mouth: Mucous membranes are moist.     Pharynx: Oropharynx is clear.  Eyes:     General: No scleral icterus.       Right eye: No discharge.        Left eye: No discharge.     Extraocular Movements: Extraocular movements intact.     Conjunctiva/sclera: Conjunctivae normal.     Pupils: Pupils are equal,  round, and reactive to light.  Cardiovascular:     Rate and Rhythm: Normal rate and regular rhythm.     Pulses: Normal pulses.     Heart sounds: Normal heart sounds. No murmur heard.    No friction rub. No gallop.  Pulmonary:     Effort: Pulmonary effort is normal. No respiratory distress.     Breath sounds: Normal breath sounds. No stridor. No wheezing, rhonchi or rales.  Chest:     Chest wall: No tenderness.  Musculoskeletal:        General: Normal range of motion.     Cervical back: Normal range of motion and neck supple.  Skin:    General: Skin is warm and dry.     Capillary Refill: Capillary refill takes less than 2 seconds.     Coloration: Skin is not jaundiced or pale.     Findings: No bruising, erythema, lesion or rash.  Neurological:     General: No focal deficit present.     Mental Status: She is alert and oriented to person, place, and time. Mental status is at  baseline.  Psychiatric:        Mood and Affect: Mood normal.        Behavior: Behavior normal.        Thought Content: Thought content normal.        Judgment: Judgment normal.     Results for orders placed or performed in visit on 04/25/22  Microscopic Examination  Result Value Ref Range   WBC, UA 0-5 0 - 5 /hpf   RBC, Urine 3-10 (A) 0 - 2 /hpf   Epithelial Cells (non renal) 0-10 0 - 10 /hpf   Casts None seen None seen /lpf   Bacteria, UA Few None seen/Few  CBC with Differential/Platelet  Result Value Ref Range   WBC 10.4 3.4 - 10.8 x10E3/uL   RBC 4.15 3.77 - 5.28 x10E6/uL   Hemoglobin 13.8 11.1 - 15.9 g/dL   Hematocrit 63.0 16.0 - 46.6 %   MCV 98 (H) 79 - 97 fL   MCH 33.3 (H) 26.6 - 33.0 pg   MCHC 34.0 31.5 - 35.7 g/dL   RDW 10.9 32.3 - 55.7 %   Platelets 418 150 - 450 x10E3/uL   Neutrophils 61 Not Estab. %   Lymphs 30 Not Estab. %   Monocytes 5 Not Estab. %   Eos 2 Not Estab. %   Basos 1 Not Estab. %   Neutrophils Absolute 6.4 1.4 - 7.0 x10E3/uL   Lymphocytes Absolute 3.1 0.7 - 3.1 x10E3/uL   Monocytes Absolute 0.5 0.1 - 0.9 x10E3/uL   EOS (ABSOLUTE) 0.2 0.0 - 0.4 x10E3/uL   Basophils Absolute 0.1 0.0 - 0.2 x10E3/uL   Immature Granulocytes 1 Not Estab. %   Immature Grans (Abs) 0.1 0.0 - 0.1 x10E3/uL  Comprehensive metabolic panel  Result Value Ref Range   Glucose 85 70 - 99 mg/dL   BUN 11 6 - 24 mg/dL   Creatinine, Ser 3.22 0.57 - 1.00 mg/dL   eGFR 71 >02 RK/YHC/6.23   BUN/Creatinine Ratio 11 9 - 23   Sodium 139 134 - 144 mmol/L   Potassium 4.3 3.5 - 5.2 mmol/L   Chloride 103 96 - 106 mmol/L   CO2 22 20 - 29 mmol/L   Calcium 9.6 8.7 - 10.2 mg/dL   Total Protein 6.4 6.0 - 8.5 g/dL   Albumin 4.1 3.9 - 4.9 g/dL   Globulin, Total 2.3 1.5 - 4.5 g/dL  Albumin/Globulin Ratio 1.8 1.2 - 2.2   Bilirubin Total 0.3 0.0 - 1.2 mg/dL   Alkaline Phosphatase 81 44 - 121 IU/L   AST 21 0 - 40 IU/L   ALT 25 0 - 32 IU/L  Lipid Panel w/o Chol/HDL Ratio  Result Value Ref  Range   Cholesterol, Total 205 (H) 100 - 199 mg/dL   Triglycerides 962 (H) 0 - 149 mg/dL   HDL 62 >95 mg/dL   VLDL Cholesterol Cal 28 5 - 40 mg/dL   LDL Chol Calc (NIH) 284 (H) 0 - 99 mg/dL  Urinalysis, Routine w reflex microscopic  Result Value Ref Range   Specific Gravity, UA 1.010 1.005 - 1.030   pH, UA 7.0 5.0 - 7.5   Color, UA Yellow Yellow   Appearance Ur Clear Clear   Leukocytes,UA 1+ (A) Negative   Protein,UA Negative Negative/Trace   Glucose, UA Negative Negative   Ketones, UA Negative Negative   RBC, UA 1+ (A) Negative   Bilirubin, UA Negative Negative   Urobilinogen, Ur 1.0 0.2 - 1.0 mg/dL   Nitrite, UA Negative Negative   Microscopic Examination See below:   TSH  Result Value Ref Range   TSH 1.050 0.450 - 4.500 uIU/mL      Assessment & Plan:   Problem List Items Addressed This Visit       Other   Depression, major, recurrent, in remission (HCC) - Primary    Would like to cut down on pills. Will cut her celexa to 20mg  and increase her wellbutrin to 300mg . Recheck about 6 weeks. Call with any concerns.       Relevant Medications   citalopram (CELEXA) 20 MG tablet   buPROPion (WELLBUTRIN XL) 300 MG 24 hr tablet     Follow up plan: Return in about 6 weeks (around 01/13/2023) for virtual OK.

## 2022-12-21 ENCOUNTER — Other Ambulatory Visit: Payer: Self-pay | Admitting: Family Medicine

## 2022-12-22 NOTE — Telephone Encounter (Signed)
Requested medication (s) are due for refill today: yes  Requested medication (s) are on the active medication list: yes  Last refill:  11/13/22 #30  Future visit scheduled: yes  Notes to clinic:  Med not delegated to NT to RF    Requested Prescriptions  Pending Prescriptions Disp Refills   cyclobenzaprine (FLEXERIL) 10 MG tablet [Pharmacy Med Name: Cyclobenzaprine HCl 10 MG Oral Tablet] 30 tablet 0    Sig: TAKE 1 TABLET BY MOUTH AT BEDTIME     Not Delegated - Analgesics:  Muscle Relaxants Failed - 12/21/2022  8:40 PM      Failed - This refill cannot be delegated      Passed - Valid encounter within last 6 months    Recent Outpatient Visits           2 weeks ago Depression, major, recurrent, in remission (HCC)   [place] [facility] [place], Megan P, DO   6 months ago Depression, major, recurrent, in remission [facility])   [place] [facility] [place], Megan P, DO   8 months ago Routine general medical examination at a health care facility   [facility] [place], [place] P, DO   1 year ago Depression, major, recurrent, in remission [facility])   [place] [facility] [place], Megan P, DO   1 year ago Need for influenza vaccination   [place] [facility] Dorcas Carrow, DO       Future Appointments             In 3 weeks [place], Oralia Rud, DO [facility]

## 2022-12-26 ENCOUNTER — Ambulatory Visit: Payer: Managed Care, Other (non HMO) | Admitting: Family Medicine

## 2023-01-13 ENCOUNTER — Telehealth (INDEPENDENT_AMBULATORY_CARE_PROVIDER_SITE_OTHER): Payer: Managed Care, Other (non HMO) | Admitting: Family Medicine

## 2023-01-13 ENCOUNTER — Encounter: Payer: Self-pay | Admitting: Family Medicine

## 2023-01-13 VITALS — Wt 165.0 lb

## 2023-01-13 DIAGNOSIS — F3340 Major depressive disorder, recurrent, in remission, unspecified: Secondary | ICD-10-CM

## 2023-01-13 MED ORDER — CITALOPRAM HYDROBROMIDE 20 MG PO TABS: 20.0000 mg | ORAL_TABLET | Freq: Every day | ORAL | 1 refills | Status: DC

## 2023-01-13 MED ORDER — BUPROPION HCL ER (XL) 300 MG PO TB24: 300.0000 mg | ORAL_TABLET | Freq: Every day | ORAL | 1 refills | Status: DC

## 2023-01-13 NOTE — Progress Notes (Signed)
Wt 165 lb (74.8 kg)   BMI 29.14 kg/m    Subjective:    Patient ID: Sandra Wright, female    DOB: [birth year], 47 y.o.   MRN: [id number]  HPI: Sandra Wright is a 47 y.o. female  Chief Complaint  Patient presents with   Depression    Patient says she is doing great with the new changes of her medications.    DEPRESSION Mood status: better Satisfied with current treatment?: yes Symptom severity: mild  Duration of current treatment : months Side effects: no Medication compliance: excellent compliance Psychotherapy/counseling: no  Previous psychiatric medications: celexa, wellbutrin Depressed mood: no Anxious mood: no Anhedonia: no Significant weight loss or gain: no Insomnia: no  Fatigue: no Feelings of worthlessness or guilt: no Impaired concentration/indecisiveness: no Suicidal ideations: no Hopelessness: no Crying spells: no    01/13/2023    9:11 AM 12/02/2022    3:29 PM 06/04/2022   10:41 AM 04/25/2022    9:44 AM 10/15/2021   11:13 AM  Depression screen PHQ 2/9  Decreased Interest 0 0 0 0 0  Down, Depressed, Hopeless 0 0 0 0 0  PHQ - 2 Score 0 0 0 0 0  Altered sleeping 0 0 0 0 0  Tired, decreased energy 0 0 0 1 0  Change in appetite 0 0 1 0 0  Feeling bad or failure about yourself  0 0 0 0 0  Trouble concentrating 0 0 0 0 0  Moving slowly or fidgety/restless 0 0 0 0 0  Suicidal thoughts 0 0 0 0 0  PHQ-9 Score 0 0 1 1 0  Difficult doing work/chores Not difficult at all Not difficult at all Not difficult at all Somewhat difficult Not difficult at all     Relevant past medical, surgical, family and social history reviewed and updated as indicated. Interim medical history since our last visit reviewed. Allergies and medications reviewed and updated.  Review of Systems  Constitutional: Negative.   Respiratory: Negative.    Cardiovascular: Negative.   Gastrointestinal: Negative.   Musculoskeletal: Negative.   Neurological: Negative.    Psychiatric/Behavioral: Negative.      Per HPI unless specifically indicated above     Objective:    Wt 165 lb (74.8 kg)   BMI 29.14 kg/m   Wt Readings from Last 3 Encounters:  01/13/23 165 lb (74.8 kg)  12/02/22 177 lb 3.2 oz (80.4 kg)  06/04/22 168 lb 3.2 oz (76.3 kg)    Physical Exam Vitals and nursing note reviewed.  Constitutional:      General: She is not in acute distress.    Appearance: Normal appearance. She is not ill-appearing, toxic-appearing or diaphoretic.  HENT:     Head: Normocephalic and atraumatic.     Right Ear: External ear normal.     Left Ear: External ear normal.     Nose: Nose normal.     Mouth/Throat:     Mouth: Mucous membranes are moist.     Pharynx: Oropharynx is clear.  Eyes:     General: No scleral icterus.       Right eye: No discharge.        Left eye: No discharge.     Conjunctiva/sclera: Conjunctivae normal.     Pupils: Pupils are equal, round, and reactive to light.  Pulmonary:     Effort: Pulmonary effort is normal. No respiratory distress.     Comments: Speaking in full sentences Musculoskeletal:  General: Normal range of motion.     Cervical back: Normal range of motion.  Skin:    Coloration: Skin is not jaundiced or pale.     Findings: No bruising, erythema, lesion or rash.  Neurological:     Mental Status: She is alert and oriented to person, place, and time. Mental status is at baseline.  Psychiatric:        Mood and Affect: Mood normal.        Behavior: Behavior normal.        Thought Content: Thought content normal.        Judgment: Judgment normal.     Results for orders placed or performed in visit on 04/25/22  Microscopic Examination  Result Value Ref Range   WBC, UA 0-5 0 - 5 /hpf   RBC, Urine 3-10 (A) 0 - 2 /hpf   Epithelial Cells (non renal) 0-10 0 - 10 /hpf   Casts None seen None seen /lpf   Bacteria, UA Few None seen/Few  CBC with Differential/Platelet  Result Value Ref Range   WBC 10.4 3.4 -  10.8 x10E3/uL   RBC 4.15 3.77 - 5.28 x10E6/uL   Hemoglobin 13.8 11.1 - 15.9 g/dL   Hematocrit 19.1 47.8 - 46.6 %   MCV 98 (H) 79 - 97 fL   MCH 33.3 (H) 26.6 - 33.0 pg   MCHC 34.0 31.5 - 35.7 g/dL   RDW 29.5 62.1 - 30.8 %   Platelets 418 150 - 450 x10E3/uL   Neutrophils 61 Not Estab. %   Lymphs 30 Not Estab. %   Monocytes 5 Not Estab. %   Eos 2 Not Estab. %   Basos 1 Not Estab. %   Neutrophils Absolute 6.4 1.4 - 7.0 x10E3/uL   Lymphocytes Absolute 3.1 0.7 - 3.1 x10E3/uL   Monocytes Absolute 0.5 0.1 - 0.9 x10E3/uL   EOS (ABSOLUTE) 0.2 0.0 - 0.4 x10E3/uL   Basophils Absolute 0.1 0.0 - 0.2 x10E3/uL   Immature Granulocytes 1 Not Estab. %   Immature Grans (Abs) 0.1 0.0 - 0.1 x10E3/uL  Comprehensive metabolic panel  Result Value Ref Range   Glucose 85 70 - 99 mg/dL   BUN 11 6 - 24 mg/dL   Creatinine, Ser 6.57 0.57 - 1.00 mg/dL   eGFR 71 >84 ON/GEX/5.28   BUN/Creatinine Ratio 11 9 - 23   Sodium 139 134 - 144 mmol/L   Potassium 4.3 3.5 - 5.2 mmol/L   Chloride 103 96 - 106 mmol/L   CO2 22 20 - 29 mmol/L   Calcium 9.6 8.7 - 10.2 mg/dL   Total Protein 6.4 6.0 - 8.5 g/dL   Albumin 4.1 3.9 - 4.9 g/dL   Globulin, Total 2.3 1.5 - 4.5 g/dL   Albumin/Globulin Ratio 1.8 1.2 - 2.2   Bilirubin Total 0.3 0.0 - 1.2 mg/dL   Alkaline Phosphatase 81 44 - 121 IU/L   AST 21 0 - 40 IU/L   ALT 25 0 - 32 IU/L  Lipid Panel w/o Chol/HDL Ratio  Result Value Ref Range   Cholesterol, Total 205 (H) 100 - 199 mg/dL   Triglycerides 413 (H) 0 - 149 mg/dL   HDL 62 >24 mg/dL   VLDL Cholesterol Cal 28 5 - 40 mg/dL   LDL Chol Calc (NIH) 401 (H) 0 - 99 mg/dL  Urinalysis, Routine w reflex microscopic  Result Value Ref Range   Specific Gravity, UA 1.010 1.005 - 1.030   pH, UA 7.0 5.0 - 7.5  Color, UA Yellow Yellow   Appearance Ur Clear Clear   Leukocytes,UA 1+ (A) Negative   Protein,UA Negative Negative/Trace   Glucose, UA Negative Negative   Ketones, UA Negative Negative   RBC, UA 1+ (A) Negative    Bilirubin, UA Negative Negative   Urobilinogen, Ur 1.0 0.2 - 1.0 mg/dL   Nitrite, UA Negative Negative   Microscopic Examination See below:   TSH  Result Value Ref Range   TSH 1.050 0.450 - 4.500 uIU/mL      Assessment & Plan:   Problem List Items Addressed This Visit       Other   Depression, major, recurrent, in remission (HCC) - Primary    Under good control on current regimen. Continue current regimen. Continue to monitor. Call with any concerns. Refills given.        Relevant Medications   buPROPion (WELLBUTRIN XL) 300 MG 24 hr tablet   citalopram (CELEXA) 20 MG tablet     Follow up plan: Return in about 3 months (around 04/27/2023) for physical.   This visit was completed via video visit through MyChart due to the restrictions of the COVID-19 pandemic. All issues as above were discussed and addressed. Physical exam was done as above through visual confirmation on video through MyChart. If it was felt that the patient should be evaluated in the office, they were directed there. The patient verbally consented to this visit. Location of the patient: parking lot Location of the provider: work Those involved with this call:  Provider: Olevia Perches, DO CMA: Malen Gauze, CMA Front Desk/Registration:  Servando Snare   Time spent on call:  15 minutes with patient face to face via video conference. More than 50% of this time was spent in counseling and coordination of care. 23 minutes total spent in review of patient's record and preparation of their chart.

## 2023-01-13 NOTE — Assessment & Plan Note (Signed)
Under good control on current regimen. Continue current regimen. Continue to monitor. Call with any concerns. Refills given.   

## 2023-01-14 NOTE — Progress Notes (Signed)
Appointment has been scheduled.

## 2023-02-18 ENCOUNTER — Telehealth: Payer: Managed Care, Other (non HMO) | Admitting: Physician Assistant

## 2023-02-18 DIAGNOSIS — J208 Acute bronchitis due to other specified organisms: Secondary | ICD-10-CM | POA: Diagnosis not present

## 2023-02-18 DIAGNOSIS — B9689 Other specified bacterial agents as the cause of diseases classified elsewhere: Secondary | ICD-10-CM | POA: Diagnosis not present

## 2023-02-18 MED ORDER — BENZONATATE 100 MG PO CAPS
100.0000 mg | ORAL_CAPSULE | Freq: Three times a day (TID) | ORAL | 0 refills | Status: DC | PRN
Start: 2023-02-18 — End: 2023-04-29

## 2023-02-18 MED ORDER — AZITHROMYCIN 250 MG PO TABS
ORAL_TABLET | ORAL | 0 refills | Status: AC
Start: 2023-02-18 — End: 2023-02-23

## 2023-02-18 MED ORDER — ALBUTEROL SULFATE HFA 108 (90 BASE) MCG/ACT IN AERS: 2.0000 | INHALATION_SPRAY | Freq: Four times a day (QID) | RESPIRATORY_TRACT | 0 refills | Status: DC | PRN

## 2023-02-18 NOTE — Progress Notes (Signed)
Message sent to patient requesting further input regarding current symptoms. Awaiting patient response.  

## 2023-02-18 NOTE — Progress Notes (Signed)
I have spent 5 minutes in review of e-visit questionnaire, review and updating patient chart, medical decision making and response to patient.   Mia Milan Cody Jacklynn Dehaas, PA-C    

## 2023-02-18 NOTE — Progress Notes (Signed)
E-Visit for Cough  We are sorry that you are not feeling well.  Here is how we plan to help!  Based on your presentation I believe you most likely have A cough due to bacteria.  When patients have a fever and a productive cough with a change in color or increased sputum production, we are concerned about bacterial bronchitis.  If left untreated it can progress to pneumonia.  If your symptoms do not improve with your treatment plan it is important that you contact your provider.   I have prescribed Azithromyin 250 mg: two tablets now and then one tablet daily for 4 additonal days    In addition you may use A prescription cough medication called Tessalon Perles 100mg . You may take 1-2 capsules every 8 hours as needed for your cough. I have also prescribed an albuterol inhaler to use as directed to help relax your airways.  From your responses in the eVisit questionnaire you describe inflammation in the upper respiratory tract which is causing a significant cough.  This is commonly called Bronchitis and has four common causes:   Allergies Viral Infections Acid Reflux Bacterial Infection Allergies, viruses and acid reflux are treated by controlling symptoms or eliminating the cause. An example might be a cough caused by taking certain blood pressure medications. You stop the cough by changing the medication. Another example might be a cough caused by acid reflux. Controlling the reflux helps control the cough.  USE OF BRONCHODILATOR ("RESCUE") INHALERS: There is a risk from using your bronchodilator too frequently.  The risk is that over-reliance on a medication which only relaxes the muscles surrounding the breathing tubes can reduce the effectiveness of medications prescribed to reduce swelling and congestion of the tubes themselves.  Although you feel brief relief from the bronchodilator inhaler, your asthma may actually be worsening with the tubes becoming more swollen and filled with mucus.  This  can delay other crucial treatments, such as oral steroid medications. If you need to use a bronchodilator inhaler daily, several times per day, you should discuss this with your provider.  There are probably better treatments that could be used to keep your asthma under control.     HOME CARE Only take medications as instructed by your medical team. Complete the entire course of an antibiotic. Drink plenty of fluids and get plenty of rest. Avoid close contacts especially the very young and the elderly Cover your mouth if you cough or cough into your sleeve. Always remember to wash your hands A steam or ultrasonic humidifier can help congestion.   GET HELP RIGHT AWAY IF: You develop worsening fever. You become short of breath You cough up blood. Your symptoms persist after you have completed your treatment plan MAKE SURE YOU  Understand these instructions. Will watch your condition. Will get help right away if you are not doing well or get worse.    Thank you for choosing an e-visit.  Your e-visit answers were reviewed by a board certified advanced clinical practitioner to complete your personal care plan. Depending upon the condition, your plan could have included both over the counter or prescription medications.  Please review your pharmacy choice. Make sure the pharmacy is open so you can pick up prescription now. If there is a problem, you may contact your provider through [employer] and have the prescription routed to another pharmacy.  Your safety is important to [place]. If you have drug allergies check your prescription carefully.   For the next  24 hours you can use MyChart to ask questions about today's visit, request a non-urgent call back, or ask for a work or school excuse. You will get an email in the next two days asking about your experience. I hope that your e-visit has been valuable and will speed your recovery.

## 2023-03-07 ENCOUNTER — Other Ambulatory Visit: Payer: Self-pay | Admitting: Family Medicine

## 2023-03-09 NOTE — Telephone Encounter (Signed)
Requested medication (s) are due for refill today: Yes  Requested medication (s) are on the active medication list: Yes  Last refill:  12/24/22  Future visit scheduled: Yes  Notes to clinic:  Not delegated.    Requested Prescriptions  Pending Prescriptions Disp Refills   cyclobenzaprine (FLEXERIL) 10 MG tablet [Pharmacy Med Name: Cyclobenzaprine HCl 10 MG Oral Tablet] 30 tablet 0    Sig: TAKE 1 TABLET BY MOUTH AT BEDTIME     Not Delegated - Analgesics:  Muscle Relaxants Failed - 03/07/2023  8:17 PM      Failed - This refill cannot be delegated      Passed - Valid encounter within last 6 months    Recent Outpatient Visits           1 month ago Depression, major, recurrent, in remission (HCC)   [place] [facility] [place], Megan P, DO   3 months ago Depression, major, recurrent, in remission [facility])   [place] [facility] [place], Megan P, DO   9 months ago Depression, major, recurrent, in remission [facility])   [place] [place], Megan P, DO   10 months ago Routine general medical examination at a health care facility   [facility] [place], [place] P, DO   1 year ago Depression, major, recurrent, in remission [facility])   [facility] Dorcas Carrow, DO       Future Appointments             In 1 month Johnson, Oralia Rud, DO [place] [facility]

## 2023-04-29 ENCOUNTER — Encounter: Payer: Self-pay | Admitting: Family Medicine

## 2023-04-29 ENCOUNTER — Ambulatory Visit (INDEPENDENT_AMBULATORY_CARE_PROVIDER_SITE_OTHER): Payer: Managed Care, Other (non HMO) | Admitting: Family Medicine

## 2023-04-29 VITALS — BP 126/76 | HR 80 | Temp 97.4°F | Resp 16 | Wt 164.0 lb

## 2023-04-29 DIAGNOSIS — R062 Wheezing: Secondary | ICD-10-CM | POA: Diagnosis not present

## 2023-04-29 DIAGNOSIS — F1021 Alcohol dependence, in remission: Secondary | ICD-10-CM

## 2023-04-29 DIAGNOSIS — F3340 Major depressive disorder, recurrent, in remission, unspecified: Secondary | ICD-10-CM

## 2023-04-29 DIAGNOSIS — Z23 Encounter for immunization: Secondary | ICD-10-CM

## 2023-04-29 DIAGNOSIS — Z1231 Encounter for screening mammogram for malignant neoplasm of breast: Secondary | ICD-10-CM

## 2023-04-29 DIAGNOSIS — Z0000 Encounter for general adult medical examination without abnormal findings: Secondary | ICD-10-CM

## 2023-04-29 MED ORDER — BUSPIRONE HCL 5 MG PO TABS: ORAL_TABLET | ORAL | 1 refills | Status: DC

## 2023-04-29 MED ORDER — CITALOPRAM HYDROBROMIDE 20 MG PO TABS: 20.0000 mg | ORAL_TABLET | Freq: Every day | ORAL | 1 refills | Status: DC

## 2023-04-29 MED ORDER — VALACYCLOVIR HCL 1 G PO TABS: 1000.0000 mg | ORAL_TABLET | Freq: Every day | ORAL | 3 refills | Status: DC

## 2023-04-29 MED ORDER — MONTELUKAST SODIUM 10 MG PO TABS: 10.0000 mg | ORAL_TABLET | Freq: Every day | ORAL | 3 refills | Status: DC

## 2023-04-29 MED ORDER — CYCLOBENZAPRINE HCL 10 MG PO TABS: 10.0000 mg | ORAL_TABLET | Freq: Every day | ORAL | 3 refills | Status: AC

## 2023-04-29 MED ORDER — ALBUTEROL SULFATE HFA 108 (90 BASE) MCG/ACT IN AERS: 2.0000 | INHALATION_SPRAY | Freq: Four times a day (QID) | RESPIRATORY_TRACT | 3 refills | Status: DC | PRN

## 2023-04-29 MED ORDER — BUPROPION HCL ER (XL) 300 MG PO TB24: 300.0000 mg | ORAL_TABLET | Freq: Every day | ORAL | 1 refills | Status: DC

## 2023-04-29 NOTE — Progress Notes (Signed)
BP 126/76 (BP Location: Left Arm, Patient Position: Sitting, Cuff Size: Normal)   Pulse 80   Temp (!) 97.4 F (36.3 C) (Oral)   Resp 16   Wt 164 lb (74.4 kg)   SpO2 97%   BMI 28.96 kg/m    Subjective:    Patient ID: Sandra Wright, female    DOB: September 11, 1975, 47 y.o.   MRN: [id number]  HPI: Sandra Wright is a 47 y.o. female presenting on 04/29/2023 for comprehensive medical examination. Current medical complaints include:  DEPRESSION Mood status: controlled Satisfied with current treatment?: yes Symptom severity: mild  Duration of current treatment : chronic Side effects: no Medication compliance: excellent compliance Psychotherapy/counseling: no  Previous psychiatric medications: wellbutrin, celexa Depressed mood: no Anxious mood: no Anhedonia: no Significant weight loss or gain: no Insomnia: no  Fatigue: no Feelings of worthlessness or guilt: no Impaired concentration/indecisiveness: no Suicidal ideations: no Hopelessness: no Crying spells: no    04/29/2023   10:01 AM 01/13/2023    9:11 AM 12/02/2022    3:29 PM 06/04/2022   10:41 AM 04/25/2022    9:44 AM  Depression screen PHQ 2/9  Decreased Interest 0 0 0 0 0  Down, Depressed, Hopeless 0 0 0 0 0  PHQ - 2 Score 0 0 0 0 0  Altered sleeping 0 0 0 0 0  Tired, decreased energy 0 0 0 0 1  Change in appetite 0 0 0 1 0  Feeling bad or failure about yourself  0 0 0 0 0  Trouble concentrating 0 0 0 0 0  Moving slowly or fidgety/restless 0 0 0 0 0  Suicidal thoughts 0 0 0 0 0  PHQ-9 Score 0 0 0 1 1  Difficult doing work/chores Not difficult at all Not difficult at all Not difficult at all Not difficult at all Somewhat difficult      04/29/2023   10:02 AM 01/13/2023    9:12 AM 12/02/2022    3:30 PM 06/04/2022   10:40 AM  GAD 7 : Generalized Anxiety Score  Nervous, Anxious, on Edge 0 0 0 0  Control/stop worrying 0 0 0 0  Worry too much - different things 0 0 0 0  Trouble relaxing 0 0 0 0  Restless 0 0 0 0   Easily annoyed or irritable 0 0 0 0  Afraid - awful might happen 0 0 0 0  Total GAD 7 Score 0 0 0 0  Anxiety Difficulty Not difficult at all Not difficult at all Not difficult at all Not difficult at all    She currently lives with: husband and kids Menopausal Symptoms: no  Depression Screen done today and results listed below:     04/29/2023   10:01 AM 01/13/2023    9:11 AM 12/02/2022    3:29 PM 06/04/2022   10:41 AM 04/25/2022    9:44 AM  Depression screen PHQ 2/9  Decreased Interest 0 0 0 0 0  Down, Depressed, Hopeless 0 0 0 0 0  PHQ - 2 Score 0 0 0 0 0  Altered sleeping 0 0 0 0 0  Tired, decreased energy 0 0 0 0 1  Change in appetite 0 0 0 1 0  Feeling bad or failure about yourself  0 0 0 0 0  Trouble concentrating 0 0 0 0 0  Moving slowly or fidgety/restless 0 0 0 0 0  Suicidal thoughts 0 0 0 0 0  PHQ-9 Score 0 0 0 1  1  Difficult doing work/chores Not difficult at all Not difficult at all Not difficult at all Not difficult at all Somewhat difficult    Past Medical History:  Past Medical History:  Diagnosis Date   Degenerative cervical disc    Endometriosis     Surgical History:  Past Surgical History:  Procedure Laterality Date   AUGMENTATION MAMMAPLASTY  2001   BREAST ENHANCEMENT SURGERY     HERNIA REPAIR  09/30/2021   abdominal hernia   RIGHT OOPHORECTOMY Right     Medications:  Current Outpatient Medications on File Prior to Visit  Medication Sig   levonorgestrel-ethinyl estradiol (SEASONALE,INTROVALE,JOLESSA) 0.15-0.03 MG tablet Take 1 tablet by mouth daily.   No current facility-administered medications on file prior to visit.    Allergies:  Allergies  Allergen Reactions   Percocet [Oxycodone-Acetaminophen] Nausea And Vomiting    Social History:  Social History   Socioeconomic History   Marital status: Married    Spouse name: Sherilyn Cooter   Number of children: 2   Years of education: Not on file   Highest education level: High school graduate   Occupational History   Not on file  Tobacco Use   Smoking status: Former    Current packs/day: 0.00    Average packs/day: 1 pack/day for 15.0 years (15.0 ttl pk-yrs)    Types: Cigarettes    Start date: 05/20/1987    Quit date: 05/19/2002    Years since quitting: 20.9   Smokeless tobacco: Never  Vaping Use   Vaping status: Former  Substance and Sexual Activity   Alcohol use: Not Currently   Drug use: No   Sexual activity: Yes    Birth control/protection: Pill  Other Topics Concern   Not on file  Social History Narrative   Not on file   Social Determinants of Health   Financial Resource Strain: Low Risk  (04/29/2023)   Overall Financial Resource Strain (CARDIA)    Difficulty of Paying Living Expenses: Not hard at all  Food Insecurity: No Food Insecurity (04/29/2023)   Hunger Vital Sign    Worried About Running Out of Food in the Last Year: Never true    Ran Out of Food in the Last Year: Never true  Transportation Needs: No Transportation Needs (04/29/2023)   PRAPARE - Administrator, Civil Service (Medical): No    Lack of Transportation (Non-Medical): No  Physical Activity: Insufficiently Active (04/29/2023)   Exercise Vital Sign    Days of Exercise per Week: 2 days    Minutes of Exercise per Session: 30 min  Stress: No Stress Concern Present (04/29/2023)   [employer] of Occupational Health - Occupational Stress Questionnaire    Feeling of Stress : Only a little  Social Connections: Socially Integrated (04/29/2023)   Social Connection and Isolation Panel [NHANES]    Frequency of Communication with Friends and Family: More than three times a week    Frequency of Social Gatherings with Friends and Family: Once a week    Attends Religious Services: More than 4 times per year    Active Member of [employer] or Organizations: Yes    Attends Banker Meetings: Never    Marital Status: Married  Catering manager Violence: Not At Risk (04/29/2023)    Humiliation, Afraid, Rape, and Kick questionnaire    Fear of Current or Ex-Partner: No    Emotionally Abused: No    Physically Abused: No    Sexually Abused: No   Social History  Tobacco Use  Smoking Status Former   Current packs/day: 0.00   Average packs/day: 1 pack/day for 15.0 years (15.0 ttl pk-yrs)   Types: Cigarettes   Start date: 05/20/1987   Quit date: 05/19/2002   Years since quitting: 20.9  Smokeless Tobacco Never   Social History   Substance and Sexual Activity  Alcohol Use Not Currently    Family History:  Family History  Problem Relation Age of Onset   Cancer Mother        Lung   Arthritis Mother    Diabetes Mother    Hypertension Mother    Lung cancer Mother    Cancer - Lung Mother    Breast cancer Paternal Grandmother    Bipolar disorder Paternal Grandmother    COPD Neg Hx    Heart disease Neg Hx    Stroke Neg Hx     Past medical history, surgical history, medications, allergies, family history and social history reviewed with patient today and changes made to appropriate areas of the chart.   Review of Systems  Constitutional: Negative.   HENT: Negative.    Eyes: Negative.   Respiratory: Negative.    Cardiovascular: Negative.   Gastrointestinal: Negative.   Genitourinary: Negative.   Musculoskeletal:  Positive for back pain. Negative for falls, joint pain, myalgias and neck pain.  Skin: Negative.   Neurological:  Positive for tingling. Negative for dizziness, tremors, sensory change, speech change, focal weakness, seizures, loss of consciousness, weakness and headaches.  Endo/Heme/Allergies: Negative.   Psychiatric/Behavioral: Negative.     All other ROS negative except what is listed above and in the HPI.      Objective:    BP 126/76 (BP Location: Left Arm, Patient Position: Sitting, Cuff Size: Normal)   Pulse 80   Temp (!) 97.4 F (36.3 C) (Oral)   Resp 16   Wt 164 lb (74.4 kg)   SpO2 97%   BMI 28.96 kg/m   Wt Readings from Last 3  Encounters:  04/29/23 164 lb (74.4 kg)  01/13/23 165 lb (74.8 kg)  12/02/22 177 lb 3.2 oz (80.4 kg)    Physical Exam Vitals and nursing note reviewed.  Constitutional:      General: She is not in acute distress.    Appearance: Normal appearance. She is not ill-appearing, toxic-appearing or diaphoretic.  HENT:     Head: Normocephalic and atraumatic.     Right Ear: Tympanic membrane, ear canal and external ear normal. There is no impacted cerumen.     Left Ear: Tympanic membrane, ear canal and external ear normal. There is no impacted cerumen.     Nose: Nose normal. No congestion or rhinorrhea.     Mouth/Throat:     Mouth: Mucous membranes are moist.     Pharynx: Oropharynx is clear. No oropharyngeal exudate or posterior oropharyngeal erythema.  Eyes:     General: No scleral icterus.       Right eye: No discharge.        Left eye: No discharge.     Extraocular Movements: Extraocular movements intact.     Conjunctiva/sclera: Conjunctivae normal.     Pupils: Pupils are equal, round, and reactive to light.  Neck:     Vascular: No carotid bruit.  Cardiovascular:     Rate and Rhythm: Normal rate and regular rhythm.     Pulses: Normal pulses.     Heart sounds: No murmur heard.    No friction rub. No gallop.  Pulmonary:  Effort: Pulmonary effort is normal. No respiratory distress.     Breath sounds: Normal breath sounds. No stridor. No wheezing, rhonchi or rales.  Chest:     Chest wall: No tenderness.  Abdominal:     General: Abdomen is flat. Bowel sounds are normal. There is no distension.     Palpations: Abdomen is soft. There is no mass.     Tenderness: There is no abdominal tenderness. There is no right CVA tenderness, left CVA tenderness, guarding or rebound.     Hernia: No hernia is present.  Genitourinary:    Comments: Breast and pelvic exams deferred with shared decision making Musculoskeletal:        General: No swelling, tenderness, deformity or signs of injury.      Cervical back: Normal range of motion and neck supple. No rigidity. No muscular tenderness.     Right lower leg: No edema.     Left lower leg: No edema.  Lymphadenopathy:     Cervical: No cervical adenopathy.  Skin:    General: Skin is warm and dry.     Capillary Refill: Capillary refill takes less than 2 seconds.     Coloration: Skin is not jaundiced or pale.     Findings: No bruising, erythema, lesion or rash.  Neurological:     General: No focal deficit present.     Mental Status: She is alert and oriented to person, place, and time. Mental status is at baseline.     Cranial Nerves: No cranial nerve deficit.     Sensory: No sensory deficit.     Motor: No weakness.     Coordination: Coordination normal.     Gait: Gait normal.     Deep Tendon Reflexes: Reflexes normal.  Psychiatric:        Mood and Affect: Mood normal.        Behavior: Behavior normal.        Thought Content: Thought content normal.        Judgment: Judgment normal.     Results for orders placed or performed in visit on 04/25/22  Microscopic Examination  Result Value Ref Range   WBC, UA 0-5 0 - 5 /hpf   RBC, Urine 3-10 (A) 0 - 2 /hpf   Epithelial Cells (non renal) 0-10 0 - 10 /hpf   Casts None seen None seen /lpf   Bacteria, UA Few None seen/Few  CBC with Differential/Platelet  Result Value Ref Range   WBC 10.4 3.4 - 10.8 x10E3/uL   RBC 4.15 3.77 - 5.28 x10E6/uL   Hemoglobin 13.8 11.1 - 15.9 g/dL   Hematocrit 16.1 09.6 - 46.6 %   MCV 98 (H) 79 - 97 fL   MCH 33.3 (H) 26.6 - 33.0 pg   MCHC 34.0 31.5 - 35.7 g/dL   RDW 04.5 40.9 - 81.1 %   Platelets 418 150 - 450 x10E3/uL   Neutrophils 61 Not Estab. %   Lymphs 30 Not Estab. %   Monocytes 5 Not Estab. %   Eos 2 Not Estab. %   Basos 1 Not Estab. %   Neutrophils Absolute 6.4 1.4 - 7.0 x10E3/uL   Lymphocytes Absolute 3.1 0.7 - 3.1 x10E3/uL   Monocytes Absolute 0.5 0.1 - 0.9 x10E3/uL   EOS (ABSOLUTE) 0.2 0.0 - 0.4 x10E3/uL   Basophils Absolute 0.1 0.0 -  0.2 x10E3/uL   Immature Granulocytes 1 Not Estab. %   Immature Grans (Abs) 0.1 0.0 - 0.1 x10E3/uL  Comprehensive metabolic panel  Result Value Ref Range   Glucose 85 70 - 99 mg/dL   BUN 11 6 - 24 mg/dL   Creatinine, Ser 1.61 0.57 - 1.00 mg/dL   eGFR 71 >09 UE/AVW/0.98   BUN/Creatinine Ratio 11 9 - 23   Sodium 139 134 - 144 mmol/L   Potassium 4.3 3.5 - 5.2 mmol/L   Chloride 103 96 - 106 mmol/L   CO2 22 20 - 29 mmol/L   Calcium 9.6 8.7 - 10.2 mg/dL   Total Protein 6.4 6.0 - 8.5 g/dL   Albumin 4.1 3.9 - 4.9 g/dL   Globulin, Total 2.3 1.5 - 4.5 g/dL   Albumin/Globulin Ratio 1.8 1.2 - 2.2   Bilirubin Total 0.3 0.0 - 1.2 mg/dL   Alkaline Phosphatase 81 44 - 121 IU/L   AST 21 0 - 40 IU/L   ALT 25 0 - 32 IU/L  Lipid Panel w/o Chol/HDL Ratio  Result Value Ref Range   Cholesterol, Total 205 (H) 100 - 199 mg/dL   Triglycerides 119 (H) 0 - 149 mg/dL   HDL 62 >14 mg/dL   VLDL Cholesterol Cal 28 5 - 40 mg/dL   LDL Chol Calc (NIH) 782 (H) 0 - 99 mg/dL  Urinalysis, Routine w reflex microscopic  Result Value Ref Range   Specific Gravity, UA 1.010 1.005 - 1.030   pH, UA 7.0 5.0 - 7.5   Color, UA Yellow Yellow   Appearance Ur Clear Clear   Leukocytes,UA 1+ (A) Negative   Protein,UA Negative Negative/Trace   Glucose, UA Negative Negative   Ketones, UA Negative Negative   RBC, UA 1+ (A) Negative   Bilirubin, UA Negative Negative   Urobilinogen, Ur 1.0 0.2 - 1.0 mg/dL   Nitrite, UA Negative Negative   Microscopic Examination See below:   TSH  Result Value Ref Range   TSH 1.050 0.450 - 4.500 uIU/mL      Assessment & Plan:   Problem List Items Addressed This Visit       Other   Depression, major, recurrent, in remission (HCC)    Under good control on current regimen. Continue current regimen. Continue to monitor. Call with any concerns. Refills given.        Relevant Medications   buPROPion (WELLBUTRIN XL) 300 MG 24 hr tablet   busPIRone (BUSPAR) 5 MG tablet   citalopram  (CELEXA) 20 MG tablet   Recovering alcoholic (HCC)    Doing well. No concerns.       Other Visit Diagnoses     Routine general medical examination at a health care facility    -  Primary   Vaccines up to date. Screening labs checked today. Pap up to date. Mammo due in January. Continue diet and exercise. Call with any concerns.   Relevant Orders   CBC with Differential/Platelet   Comprehensive metabolic panel   Lipid Panel w/o Chol/HDL Ratio   TSH   Need for vaccination       Flu shot given today.   Relevant Orders   Flu vaccine trivalent PF, 6mos and older(Flulaval,Afluria,Fluarix,Fluzone) (Completed)   Encounter for screening mammogram for malignant neoplasm of breast       Mammogram ordered today.   Relevant Orders   MM 3D SCREENING MAMMOGRAM BILATERAL BREAST   Wheezing       Relevant Medications   albuterol (VENTOLIN HFA) 108 (90 Base) MCG/ACT inhaler        Follow up plan: Return in about 6 months (around 10/28/2023).   LABORATORY  TESTING:  - Pap smear: up to date  IMMUNIZATIONS:   - Tdap: Tetanus vaccination status reviewed: last tetanus booster within 10 years. - Influenza: Administered today - Pneumovax: Not applicable - Prevnar: Not applicable - COVID: Refused - HPV: Not applicable - Shingrix vaccine: Not applicable  SCREENING: -Mammogram: Up to date  - Colonoscopy: Up to date   PATIENT COUNSELING:   Advised to take 1 mg of folate supplement per day if capable of pregnancy.   Sexuality: Discussed sexually transmitted diseases, partner selection, use of condoms, avoidance of unintended pregnancy  and contraceptive alternatives.   Advised to avoid cigarette smoking.  I discussed with the patient that most people either abstain from alcohol or drink within safe limits (<=14/week and <=4 drinks/occasion for males, <=7/weeks and <= 3 drinks/occasion for females) and that the risk for alcohol disorders and other health effects rises proportionally with the  number of drinks per week and how often a drinker exceeds daily limits.  Discussed cessation/primary prevention of drug use and availability of treatment for abuse.   Diet: Encouraged to adjust caloric intake to maintain  or achieve ideal body weight, to reduce intake of dietary saturated fat and total fat, to limit sodium intake by avoiding high sodium foods and not adding table salt, and to maintain adequate dietary potassium and calcium preferably from fresh fruits, vegetables, and low-fat dairy products.    stressed the importance of regular exercise  Injury prevention: Discussed safety belts, safety helmets, smoke detector, smoking near bedding or upholstery.   Dental health: Discussed importance of regular tooth brushing, flossing, and dental visits.    NEXT PREVENTATIVE PHYSICAL DUE IN 1 YEAR. Return in about 6 months (around 10/28/2023).

## 2023-04-29 NOTE — Patient Instructions (Signed)
Please call to schedule your mammogram and/or bone density: [facility]  Address: [address], [place], [place] [address] Phone: [phone]  [facility] [place] [address] [place],  [place]  [address] Phone: [phone]   

## 2023-04-29 NOTE — Assessment & Plan Note (Signed)
Doing well.  No concerns.

## 2023-04-29 NOTE — Assessment & Plan Note (Signed)
Under good control on current regimen. Continue current regimen. Continue to monitor. Call with any concerns. Refills given.   

## 2023-04-29 NOTE — Progress Notes (Signed)
This encounter was created in error - please disregard.

## 2023-04-30 LAB — CBC WITH DIFFERENTIAL/PLATELET
Basophils Absolute: 0.1 10*3/uL (ref 0.0–0.2)
Basos: 1 %
EOS (ABSOLUTE): 0.4 10*3/uL (ref 0.0–0.4)
Eos: 4 %
Hematocrit: 42.2 % (ref 34.0–46.6)
Hemoglobin: 13.4 g/dL (ref 11.1–15.9)
Immature Grans (Abs): 0.1 10*3/uL (ref 0.0–0.1)
Immature Granulocytes: 1 %
Lymphocytes Absolute: 3.5 10*3/uL — ABNORMAL HIGH (ref 0.7–3.1)
Lymphs: 34 %
MCH: 31.5 pg (ref 26.6–33.0)
MCHC: 31.8 g/dL (ref 31.5–35.7)
MCV: 99 fL — ABNORMAL HIGH (ref 79–97)
Monocytes Absolute: 0.5 10*3/uL (ref 0.1–0.9)
Monocytes: 5 %
Neutrophils Absolute: 5.8 10*3/uL (ref 1.4–7.0)
Neutrophils: 55 %
Platelets: 442 10*3/uL (ref 150–450)
RBC: 4.25 x10E6/uL (ref 3.77–5.28)
RDW: 12.3 % (ref 11.7–15.4)
WBC: 10.3 10*3/uL (ref 3.4–10.8)

## 2023-04-30 LAB — COMPREHENSIVE METABOLIC PANEL
ALT: 55 [IU]/L — ABNORMAL HIGH (ref 0–32)
AST: 37 [IU]/L (ref 0–40)
Albumin: 4.3 g/dL (ref 3.9–4.9)
Alkaline Phosphatase: 89 [IU]/L (ref 44–121)
BUN/Creatinine Ratio: 8 — ABNORMAL LOW (ref 9–23)
BUN: 8 mg/dL (ref 6–24)
Bilirubin Total: 0.3 mg/dL (ref 0.0–1.2)
CO2: 22 mmol/L (ref 20–29)
Calcium: 9.8 mg/dL (ref 8.7–10.2)
Chloride: 102 mmol/L (ref 96–106)
Creatinine, Ser: 1.05 mg/dL — ABNORMAL HIGH (ref 0.57–1.00)
Globulin, Total: 2.2 g/dL (ref 1.5–4.5)
Glucose: 110 mg/dL — ABNORMAL HIGH (ref 70–99)
Potassium: 4.2 mmol/L (ref 3.5–5.2)
Sodium: 140 mmol/L (ref 134–144)
Total Protein: 6.5 g/dL (ref 6.0–8.5)
eGFR: 66 mL/min/{1.73_m2} (ref >59)

## 2023-04-30 LAB — LIPID PANEL W/O CHOL/HDL RATIO
Cholesterol, Total: 204 mg/dL — ABNORMAL HIGH (ref 100–199)
HDL: 55 mg/dL (ref >39)
LDL Chol Calc (NIH): 131 mg/dL — ABNORMAL HIGH (ref 0–99)
Triglycerides: 103 mg/dL (ref 0–149)
VLDL Cholesterol Cal: 18 mg/dL (ref 5–40)

## 2023-04-30 LAB — TSH: TSH: 1.43 u[IU]/mL (ref 0.450–4.500)

## 2023-07-23 ENCOUNTER — Other Ambulatory Visit: Payer: Self-pay | Admitting: Family Medicine

## 2023-07-24 NOTE — Telephone Encounter (Signed)
 Requested Prescriptions  Pending Prescriptions Disp Refills   busPIRone (BUSPAR) 5 MG tablet [Pharmacy Med Name: busPIRone HCl 5 MG Oral Tablet] 270 tablet 0    Sig: TAKE 1 TO 2 TABLETS BY MOUTH THREE TIMES DAILY     Psychiatry: Anxiolytics/Hypnotics - Non-controlled Passed - 07/24/2023  9:31 AM      Passed - Valid encounter within last 12 months    Recent Outpatient Visits           2 months ago Routine general medical examination at a health care facility   [facility], [place] P, DO   6 months ago Depression, major, recurrent, in remission [facility])   [facility] B[place], Megan P, DO   7 months ago Depression, major, recurrent, in remission [facility])   [facility] [place], Megan P, DO   1 year ago Depression, major, recurrent, in remission [facility])   [facility] [place], Megan P, DO   1 year ago Routine general medical examination at a health care facility   [facility] Dorcas Carrow, DO       Future Appointments             In 3 months Laural Benes, Oralia Rud, DO [facility]

## 2023-07-30 ENCOUNTER — Encounter: Payer: Self-pay | Admitting: Family Medicine

## 2023-07-31 ENCOUNTER — Ambulatory Visit: Admitting: Nurse Practitioner

## 2023-07-31 ENCOUNTER — Encounter: Payer: Self-pay | Admitting: Nurse Practitioner

## 2023-07-31 VITALS — BP 114/71 | HR 79 | Temp 97.8°F | Ht 63.0 in | Wt 157.4 lb

## 2023-07-31 DIAGNOSIS — N3001 Acute cystitis with hematuria: Secondary | ICD-10-CM

## 2023-07-31 DIAGNOSIS — R8281 Pyuria: Secondary | ICD-10-CM

## 2023-07-31 DIAGNOSIS — R399 Unspecified symptoms and signs involving the genitourinary system: Secondary | ICD-10-CM

## 2023-07-31 LAB — MICROSCOPIC EXAMINATION
RBC, Urine: NONE SEEN /HPF (ref 0–2)
WBC, UA: >30 /HPF — AB (ref 0–5)

## 2023-07-31 LAB — URINALYSIS, ROUTINE W REFLEX MICROSCOPIC
Bilirubin, UA: NEGATIVE
Glucose, UA: NEGATIVE
Ketones, UA: NEGATIVE
Nitrite, UA: POSITIVE — AB
Protein,UA: NEGATIVE
Specific Gravity, UA: 1.02 (ref 1.005–1.030)
Urobilinogen, Ur: 0.2 mg/dL (ref 0.2–1.0)
pH, UA: 5.5 (ref 5.0–7.5)

## 2023-07-31 MED ORDER — NITROFURANTOIN MONOHYD MACRO 100 MG PO CAPS: 100.0000 mg | ORAL_CAPSULE | Freq: Two times a day (BID) | ORAL | 0 refills | Status: AC

## 2023-07-31 NOTE — Assessment & Plan Note (Signed)
 Acute, symptoms a few days.  UA overall showing multiple positives, NIT +, BLD 2+, Leuks and Bacteria.  Will start Macrobid 100 MG BID for 5 days.  Recommend to drink plenty of water and make take Tylenol as needed.  Will adjust regimen as needed based on culture results.  Discussed plan with patient.

## 2023-07-31 NOTE — Progress Notes (Signed)
 BP 114/71   Pulse 79   Temp 97.8 F (36.6 C) (Oral)   Ht 5\' 3"  (1.6 m)   Wt 157 lb 6.4 oz (71.4 kg)   SpO2 98%   BMI 27.88 kg/m    Subjective:    Patient ID: Sandra Wright, female    DOB: [birth year], 48 y.o.   MRN: [id number]  HPI: Sandra Wright is a 48 y.o. female  Chief Complaint  Patient presents with   Urinary Tract Infection   URINARY SYMPTOMS Started with urinary symptoms 3 days ago. Dysuria: yes Urinary frequency: yes Urgency: yes Small volume voids: yes Symptom severity: yes Urinary incontinence: no Foul odor: no Hematuria: no Abdominal pain: no Back pain: no Suprapubic pain/pressure: no Flank pain: no Fever:  no Vomiting: no Status: stable Previous urinary tract infection: yes Recurrent urinary tract infection: no Sexual activity: monogamous History of sexually transmitted disease: no Treatments attempted: increasing fluids    Relevant past medical, surgical, family and social history reviewed and updated as indicated. Interim medical history since our last visit reviewed. Allergies and medications reviewed and updated.  Review of Systems  Constitutional:  Negative for activity change, appetite change, diaphoresis, fatigue and fever.  Respiratory:  Negative for cough, chest tightness, shortness of breath and wheezing.   Cardiovascular:  Negative for chest pain, palpitations and leg swelling.  Gastrointestinal: Negative.   Genitourinary:  Positive for decreased urine volume, dysuria, frequency and urgency. Negative for difficulty urinating, hematuria and pelvic pain.  Neurological: Negative.   Psychiatric/Behavioral: Negative.      Per HPI unless specifically indicated above     Objective:    BP 114/71   Pulse 79   Temp 97.8 F (36.6 C) (Oral)   Ht 5\' 3"  (1.6 m)   Wt 157 lb 6.4 oz (71.4 kg)   SpO2 98%   BMI 27.88 kg/m   Wt Readings from Last 3 Encounters:  07/31/23 157 lb 6.4 oz (71.4 kg)  04/29/23 164 lb (74.4 kg)  01/13/23 165  lb (74.8 kg)    Physical Exam Vitals and nursing note reviewed.  Constitutional:      General: She is awake. She is not in acute distress.    Appearance: She is well-developed and well-groomed. She is not ill-appearing or toxic-appearing.  HENT:     Head: Normocephalic.     Right Ear: Hearing and external ear normal.     Left Ear: Hearing and external ear normal.  Eyes:     General: Lids are normal.        Right eye: No discharge.        Left eye: No discharge.     Conjunctiva/sclera: Conjunctivae normal.     Pupils: Pupils are equal, round, and reactive to light.  Neck:     Thyroid: No thyromegaly.     Vascular: No carotid bruit.  Cardiovascular:     Rate and Rhythm: Normal rate and regular rhythm.     Heart sounds: Normal heart sounds. No murmur heard.    No gallop.  Pulmonary:     Effort: Pulmonary effort is normal. No accessory muscle usage or respiratory distress.     Breath sounds: Normal breath sounds.  Abdominal:     General: Bowel sounds are normal. There is no distension.     Palpations: Abdomen is soft.     Tenderness: There is no abdominal tenderness. There is no right CVA tenderness or left CVA tenderness.  Musculoskeletal:     Cervical  back: Normal range of motion and neck supple.     Right lower leg: No edema.     Left lower leg: No edema.  Lymphadenopathy:     Cervical: No cervical adenopathy.  Skin:    General: Skin is warm and dry.  Neurological:     Mental Status: She is alert and oriented to person, place, and time.     Deep Tendon Reflexes: Reflexes are normal and symmetric.     Reflex Scores:      Brachioradialis reflexes are 2+ on the right side and 2+ on the left side.      Patellar reflexes are 2+ on the right side and 2+ on the left side. Psychiatric:        Attention and Perception: Attention normal.        Mood and Affect: Mood normal.        Speech: Speech normal.        Behavior: Behavior normal. Behavior is cooperative.        Thought  Content: Thought content normal.     Results for orders placed or performed in visit on 04/29/23  Results Console HPV   Collection Time: 10/13/19 12:00 AM  Result Value Ref Range   CHL HPV Negative       Assessment & Plan:   Problem List Items Addressed This Visit       Genitourinary   Acute cystitis with hematuria - Primary   Acute, symptoms a few days.  UA overall showing multiple positives, NIT +, BLD 2+, Leuks and Bacteria.  Will start Macrobid 100 MG BID for 5 days.  Recommend to drink plenty of water and make take Tylenol as needed.  Will adjust regimen as needed based on culture results.  Discussed plan with patient.      Relevant Orders   Urinalysis, Routine w reflex microscopic   Other Visit Diagnoses       Pyuria       Urine sent for culture.   Relevant Orders   Urine Culture        Follow up plan: Return if symptoms worsen or fail to improve.

## 2023-07-31 NOTE — Patient Instructions (Signed)

## 2023-08-03 ENCOUNTER — Encounter: Payer: Self-pay | Admitting: Nurse Practitioner

## 2023-08-03 LAB — URINE CULTURE

## 2023-08-03 NOTE — Progress Notes (Signed)
 Contacted via MyChart   Good morning Sandra Wright, the Macrobid is susceptible to what bacteria is growing in your urine.  Complete whole course and you should get to feeling better.  Any questions?

## 2023-09-08 ENCOUNTER — Other Ambulatory Visit: Payer: Self-pay | Admitting: Family Medicine

## 2023-09-09 NOTE — Telephone Encounter (Signed)
 LOV 07/31/2023 with Jolene Cannady, NP.    Requested Prescriptions  Pending Prescriptions Disp Refills   busPIRone  (BUSPAR ) 5 MG tablet [Pharmacy Med Name: busPIRone  HCl 5 MG Oral Tablet] 270 tablet 0    Sig: TAKE 1 TO 2 TABLETS BY MOUTH THREE TIMES DAILY     Psychiatry: Anxiolytics/Hypnotics - Non-controlled Failed - 09/09/2023  9:45 AM      Failed - Valid encounter within last 12 months    Recent Outpatient Visits           1 month ago Acute cystitis with hematuria   [facility] [place] Pyles, NP       Future Appointments             In 1 month Johnson, Jerilee Montane, DO [facility], [facility]

## 2023-10-07 ENCOUNTER — Encounter: Payer: Self-pay | Admitting: Family Medicine

## 2023-10-13 ENCOUNTER — Ambulatory Visit
Admission: RE | Admit: 2023-10-13 | Discharge: 2023-10-13 | Disposition: A | Discharge status: Home / Self Care | Source: Ambulatory Visit | Attending: Family Medicine | Admitting: Family Medicine

## 2023-10-13 DIAGNOSIS — Z1231 Encounter for screening mammogram for malignant neoplasm of breast: Secondary | ICD-10-CM | POA: Insufficient documentation

## 2023-10-16 ENCOUNTER — Ambulatory Visit: Payer: Self-pay | Admitting: Family Medicine

## 2023-10-26 ENCOUNTER — Other Ambulatory Visit: Payer: Self-pay | Admitting: Family Medicine

## 2023-10-27 NOTE — Telephone Encounter (Signed)
 Requested Prescriptions  Pending Prescriptions Disp Refills   busPIRone  (BUSPAR ) 5 MG tablet [Pharmacy Med Name: busPIRone  HCl 5 MG Oral Tablet] 270 tablet 0    Sig: TAKE 1 TO 2 TABLETS BY MOUTH THREE TIMES DAILY     Psychiatry: Anxiolytics/Hypnotics - Non-controlled Failed - 10/27/2023 11:01 AM      Failed - Valid encounter within last 12 months    Recent Outpatient Visits           2 months ago Acute cystitis with hematuria   [place] [place], Lavelle Posey, NP       Future Appointments             Tomorrow Solomon Dupre, DO [facility]

## 2023-10-28 ENCOUNTER — Ambulatory Visit: Payer: Self-pay | Admitting: Family Medicine

## 2023-10-28 ENCOUNTER — Encounter: Payer: Self-pay | Admitting: Family Medicine

## 2023-10-28 VITALS — BP 109/72 | HR 71 | Temp 97.8°F | Ht 63.0 in | Wt 157.8 lb

## 2023-10-28 DIAGNOSIS — F3340 Major depressive disorder, recurrent, in remission, unspecified: Secondary | ICD-10-CM | POA: Diagnosis not present

## 2023-10-28 MED ORDER — BUSPIRONE HCL 5 MG PO TABS: ORAL_TABLET | ORAL | 1 refills | Status: DC

## 2023-10-28 MED ORDER — CITALOPRAM HYDROBROMIDE 20 MG PO TABS: 20.0000 mg | ORAL_TABLET | Freq: Every day | ORAL | 1 refills | Status: DC

## 2023-10-28 MED ORDER — BUPROPION HCL ER (XL) 300 MG PO TB24: 300.0000 mg | ORAL_TABLET | Freq: Every day | ORAL | 1 refills | Status: DC

## 2023-10-28 NOTE — Progress Notes (Signed)
 BP 109/72 (BP Location: Left Arm, Patient Position: Sitting, Cuff Size: Normal)   Pulse 71   Temp 97.8 F (36.6 C) (Oral)   Ht 5' 3 (1.6 m)   Wt 157 lb 12.8 oz (71.6 kg)   SpO2 98%   BMI 27.95 kg/m    Subjective:    Patient ID: Sandra Wright, female    DOB: [birth year], 48 y.o.   MRN: [id number]  HPI: Sandra Wright is a 48 y.o. female  Chief Complaint  Patient presents with   Depression   DEPRESSION Mood status: controlled Satisfied with current treatment?: yes Symptom severity: mild  Duration of current treatment : chronic Side effects: no Medication compliance: excellent compliance Psychotherapy/counseling: no  Previous psychiatric medications: wellbutrin , celexa  Depressed mood: no Anxious mood: no Anhedonia: no Significant weight loss or gain: no Insomnia: no  Fatigue: no Feelings of worthlessness or guilt: no Impaired concentration/indecisiveness: no Suicidal ideations: no Hopelessness: no Crying spells: no    10/28/2023    9:07 AM 07/31/2023    3:20 PM 04/29/2023   10:01 AM 01/13/2023    9:11 AM 12/02/2022    3:29 PM  Depression screen PHQ 2/9  Decreased Interest 0 0 0 0 0  Down, Depressed, Hopeless 0 0 0 0 0  PHQ - 2 Score 0 0 0 0 0  Altered sleeping 0 0 0 0 0  Tired, decreased energy 0 0 0 0 0  Change in appetite 0 0 0 0 0  Feeling bad or failure about yourself  0 0 0 0 0  Trouble concentrating 0 0 0 0 0  Moving slowly or fidgety/restless 0 0 0 0 0  Suicidal thoughts 0 0 0 0 0  PHQ-9 Score 0 0 0 0 0  Difficult doing work/chores  Not difficult at all Not difficult at all Not difficult at all Not difficult at all     Relevant past medical, surgical, family and social history reviewed and updated as indicated. Interim medical history since our last visit reviewed. Allergies and medications reviewed and updated.  Review of Systems  Constitutional: Negative.   Respiratory: Negative.    Cardiovascular: Negative.   Musculoskeletal: Negative.    Psychiatric/Behavioral: Negative.      Per HPI unless specifically indicated above     Objective:     BP 109/72 (BP Location: Left Arm, Patient Position: Sitting, Cuff Size: Normal)   Pulse 71   Temp 97.8 F (36.6 C) (Oral)   Ht 5' 3 (1.6 m)   Wt 157 lb 12.8 oz (71.6 kg)   SpO2 98%   BMI 27.95 kg/m   Wt Readings from Last 3 Encounters:  10/28/23 157 lb 12.8 oz (71.6 kg)  07/31/23 157 lb 6.4 oz (71.4 kg)  04/29/23 164 lb (74.4 kg)    Physical Exam Vitals and nursing note reviewed.  Constitutional:      General: She is not in acute distress.    Appearance: Normal appearance. She is not ill-appearing, toxic-appearing or diaphoretic.  HENT:     Head: Normocephalic and atraumatic.     Right Ear: External ear normal.     Left Ear: External ear normal.     Nose: Nose normal.     Mouth/Throat:     Mouth: Mucous membranes are moist.     Pharynx: Oropharynx is clear.  Eyes:     General: No scleral icterus.       Right eye: No discharge.  Left eye: No discharge.     Extraocular Movements: Extraocular movements intact.     Conjunctiva/sclera: Conjunctivae normal.     Pupils: Pupils are equal, round, and reactive to light.  Cardiovascular:     Rate and Rhythm: Normal rate and regular rhythm.     Pulses: Normal pulses.     Heart sounds: Normal heart sounds. No murmur heard.    No friction rub. No gallop.  Pulmonary:     Effort: Pulmonary effort is normal. No respiratory distress.     Breath sounds: Normal breath sounds. No stridor. No wheezing, rhonchi or rales.  Chest:     Chest wall: No tenderness.  Musculoskeletal:        General: Normal range of motion.     Cervical back: Normal range of motion and neck supple.  Skin:    General: Skin is warm and dry.     Capillary Refill: Capillary refill takes less than 2 seconds.     Coloration: Skin is not jaundiced or pale.     Findings: No bruising, erythema, lesion or rash.  Neurological:     General: No focal  deficit present.     Mental Status: She is alert and oriented to person, place, and time. Mental status is at baseline.  Psychiatric:        Mood and Affect: Mood normal.        Behavior: Behavior normal.        Thought Content: Thought content normal.        Judgment: Judgment normal.     Results for orders placed or performed in visit on 07/31/23  Urine Culture   Collection Time: 07/31/23  4:08 AM   Specimen: Urine   UC  Result Value Ref Range   Urine Culture, Routine Final report (A)    Organism ID, Bacteria Escherichia coli (A)    Antimicrobial Susceptibility Comment   Microscopic Examination   Collection Time: 07/31/23  3:23 PM   Urine  Result Value Ref Range   WBC, UA >30 (A) 0 - 5 /hpf   RBC, Urine None seen 0 - 2 /hpf   Epithelial Cells (non renal) 0-10 0 - 10 /hpf   Bacteria, UA Moderate (A) None seen/Few  Urinalysis, Routine w reflex microscopic   Collection Time: 07/31/23  3:23 PM  Result Value Ref Range   Specific Gravity, UA 1.020 1.005 - 1.030   pH, UA 5.5 5.0 - 7.5   Color, UA Yellow Yellow   Appearance Ur Cloudy (A) Clear   Leukocytes,UA 2+ (A) Negative   Protein,UA Negative Negative/Trace   Glucose, UA Negative Negative   Ketones, UA Negative Negative   RBC, UA 2+ (A) Negative   Bilirubin, UA Negative Negative   Urobilinogen, Ur 0.2 0.2 - 1.0 mg/dL   Nitrite, UA Positive (A) Negative   Microscopic Examination See below:       Assessment & Plan:   Problem List Items Addressed This Visit       Other   Depression, major, recurrent, in remission (HCC) - Primary   Under good control on current regimen. Continue current regimen. Continue to monitor. Call with any concerns. Refills given.        Relevant Medications   buPROPion  (WELLBUTRIN  XL) 300 MG 24 hr tablet   busPIRone  (BUSPAR ) 5 MG tablet   citalopram  (CELEXA ) 20 MG tablet     Follow up plan: Return in about 6 months (around 04/28/2024) for physical.

## 2023-10-28 NOTE — Assessment & Plan Note (Signed)
 Under good control on current regimen. Continue current regimen. Continue to monitor. Call with any concerns. Refills given.

## 2024-02-12 ENCOUNTER — Other Ambulatory Visit: Payer: Self-pay | Admitting: Family Medicine

## 2024-02-15 NOTE — Telephone Encounter (Signed)
 Requested medication (s) are due for refill today: yes  Requested medication (s) are on the active medication list: yes  Last refill:  04/29/23  Future visit scheduled: yes  Notes to clinic:  Unable to refill per protocol, cannot delegate.      Requested Prescriptions  Pending Prescriptions Disp Refills   cyclobenzaprine  (FLEXERIL ) 10 MG tablet [Pharmacy Med Name: Cyclobenzaprine  HCl 10 MG Oral Tablet] 30 tablet 0    Sig: TAKE 1 TABLET BY MOUTH AT BEDTIME     Not Delegated - Analgesics:  Muscle Relaxants Failed - 02/15/2024  4:02 PM      Failed - This refill cannot be delegated      Passed - Valid encounter within last 6 months    Recent Outpatient Visits           3 months ago Depression, major, recurrent, in remission   [place] [place], Megan P, DO   6 months ago Acute cystitis with hematuria   [place] [facility] [place], Melanie DASEN, NP

## 2024-05-03 ENCOUNTER — Ambulatory Visit: Admitting: Family Medicine

## 2024-05-03 ENCOUNTER — Encounter: Payer: Self-pay | Admitting: Family Medicine

## 2024-05-03 VITALS — BP 119/80 | HR 81 | Temp 98.4°F | Ht 63.0 in | Wt 138.8 lb

## 2024-05-03 DIAGNOSIS — Z0000 Encounter for general adult medical examination without abnormal findings: Secondary | ICD-10-CM

## 2024-05-03 DIAGNOSIS — F3340 Major depressive disorder, recurrent, in remission, unspecified: Secondary | ICD-10-CM

## 2024-05-03 DIAGNOSIS — Z1231 Encounter for screening mammogram for malignant neoplasm of breast: Secondary | ICD-10-CM

## 2024-05-03 DIAGNOSIS — R062 Wheezing: Secondary | ICD-10-CM

## 2024-05-03 DIAGNOSIS — A6000 Herpesviral infection of urogenital system, unspecified: Secondary | ICD-10-CM

## 2024-05-03 DIAGNOSIS — J209 Acute bronchitis, unspecified: Secondary | ICD-10-CM

## 2024-05-03 MED ORDER — PREDNISONE 10 MG PO TABS: ORAL_TABLET | ORAL | 0 refills | Status: AC

## 2024-05-03 MED ORDER — ALBUTEROL SULFATE HFA 108 (90 BASE) MCG/ACT IN AERS: 2.0000 | INHALATION_SPRAY | Freq: Four times a day (QID) | RESPIRATORY_TRACT | 3 refills | Status: AC | PRN

## 2024-05-03 MED ORDER — MONTELUKAST SODIUM 10 MG PO TABS: 10.0000 mg | ORAL_TABLET | Freq: Every day | ORAL | 3 refills | Status: AC

## 2024-05-03 MED ORDER — BUPROPION HCL ER (XL) 300 MG PO TB24: 300.0000 mg | ORAL_TABLET | Freq: Every day | ORAL | 1 refills | Status: AC

## 2024-05-03 MED ORDER — CITALOPRAM HYDROBROMIDE 20 MG PO TABS: 20.0000 mg | ORAL_TABLET | Freq: Every day | ORAL | 1 refills | Status: AC

## 2024-05-03 MED ORDER — AZITHROMYCIN 250 MG PO TABS: ORAL_TABLET | ORAL | 0 refills | Status: AC

## 2024-05-03 MED ORDER — VALACYCLOVIR HCL 1 G PO TABS: 1000.0000 mg | ORAL_TABLET | Freq: Every day | ORAL | 3 refills | Status: AC

## 2024-05-03 MED ORDER — BUSPIRONE HCL 5 MG PO TABS: ORAL_TABLET | ORAL | 1 refills | Status: AC

## 2024-05-03 MED ORDER — METHYLPREDNISOLONE SODIUM SUCC 40 MG IJ SOLR
40.0000 mg | Freq: Once | INTRAMUSCULAR | Status: AC
  Administered 2024-05-03: 09:00:00 40 mg via INTRAMUSCULAR

## 2024-05-03 NOTE — Progress Notes (Signed)
 BP 119/80 (BP Location: Left Arm, Cuff Size: Normal)   Pulse 81   Temp 98.4 F (36.9 C) (Oral)   Ht 5' 3 (1.6 m)   Wt 138 lb 12.8 oz (63 kg)   SpO2 95%   BMI 24.59 kg/m    Subjective:    Patient ID: Sandra Wright, female    DOB: Aug 09, 1975, 48 y.o.   MRN: [id number]  HPI: Sandra Wright is a 48 y.o. female presenting on 05/03/2024 for comprehensive medical examination. Current medical complaints include:  UPPER RESPIRATORY TRACT INFECTION Duration: about a week Worst symptom: cough, laryngitis Fever: no Cough: yes Shortness of breath: no Wheezing: no Chest pain: no Chest tightness: no Chest congestion: yes Nasal congestion: yes Runny nose: yes Post nasal drip: yes Sneezing: no Sore throat: yes Swollen glands: no Sinus pressure: no Headache: no Face pain: no Toothache: no Ear pain: no  Ear pressure: no  Eyes red/itching:no Eye drainage/crusting: no  Vomiting: no Rash: no Fatigue: yes Sick contacts: no Strep contacts: no  Context: stable Recurrent sinusitis: no Relief with OTC cold/cough medications: no  Treatments attempted: cold/sinus, mucinex, anti-histamine, pseudoephedrine, and cough syrup   DEPRESSION Mood status: controlled Satisfied with current treatment?: yes Symptom severity: mild  Duration of current treatment : chronic Side effects: no Medication compliance: excellent compliance Psychotherapy/counseling: no  Previous psychiatric medications: celexa , wellbutrin  Depressed mood: no Anxious mood: no Anhedonia: no Significant weight loss or gain: no Insomnia: no  Fatigue: no Feelings of worthlessness or guilt: no Impaired concentration/indecisiveness: no Suicidal ideations: no Hopelessness: no Crying spells: no    05/03/2024    8:31 AM 10/28/2023    9:07 AM 07/31/2023    3:20 PM 04/29/2023   10:01 AM 01/13/2023    9:11 AM  Depression screen PHQ 2/9  Decreased Interest 0 0 0 0 0  Down, Depressed, Hopeless 0 0 0 0 0  PHQ - 2 Score  0 0 0 0 0  Altered sleeping 0 0 0 0 0  Tired, decreased energy 0 0 0 0 0  Change in appetite 0 0 0 0 0  Feeling bad or failure about yourself  0 0 0 0 0  Trouble concentrating 0 0 0 0 0  Moving slowly or fidgety/restless 0 0 0 0 0  Suicidal thoughts 0 0 0 0 0  PHQ-9 Score 0 0  0  0  0   Difficult doing work/chores   Not difficult at all Not difficult at all Not difficult at all     Data saved with a previous flowsheet row definition    She currently lives with: husband Menopausal Symptoms: no  Depression Screen done today and results listed below:     05/03/2024    8:31 AM 10/28/2023    9:07 AM 07/31/2023    3:20 PM 04/29/2023   10:01 AM 01/13/2023    9:11 AM  Depression screen PHQ 2/9  Decreased Interest 0 0 0 0 0  Down, Depressed, Hopeless 0 0 0 0 0  PHQ - 2 Score 0 0 0 0 0  Altered sleeping 0 0 0 0 0  Tired, decreased energy 0 0 0 0 0  Change in appetite 0 0 0 0 0  Feeling bad or failure about yourself  0 0 0 0 0  Trouble concentrating 0 0 0 0 0  Moving slowly or fidgety/restless 0 0 0 0 0  Suicidal thoughts 0 0 0 0 0  PHQ-9 Score 0 0  0  0  0   Difficult doing work/chores   Not difficult at all Not difficult at all Not difficult at all     Data saved with a previous flowsheet row definition    Past Medical History:  Past Medical History:  Diagnosis Date   Degenerative cervical disc    Endometriosis     Surgical History:  Past Surgical History:  Procedure Laterality Date   AUGMENTATION MAMMAPLASTY  2001   BREAST ENHANCEMENT SURGERY     HERNIA REPAIR  09/30/2021   abdominal hernia   RIGHT OOPHORECTOMY Right     Medications:  Current Outpatient Medications on File Prior to Visit  Medication Sig   cyclobenzaprine  (FLEXERIL ) 10 MG tablet Take 1 tablet (10 mg total) by mouth at bedtime.   levonorgestrel-ethinyl estradiol (SEASONALE,INTROVALE,JOLESSA) 0.15-0.03 MG tablet Take 1 tablet by mouth daily.   linaclotide (LINZESS) 72 MCG capsule Take 72 mcg by  mouth.   No current facility-administered medications on file prior to visit.    Allergies:  Allergies[1]  Social History:  Social History   Socioeconomic History   Marital status: Married    Spouse name: Victory   Number of children: 2   Years of education: Not on file   Highest education level: High school graduate  Occupational History   Not on file  Tobacco Use   Smoking status: Former    Current packs/day: 0.00    Average packs/day: 1 pack/day for 15.0 years (15.0 ttl pk-yrs)    Types: Cigarettes    Start date: 05/20/1987    Quit date: 05/19/2002    Years since quitting: 21.9   Smokeless tobacco: Never  Vaping Use   Vaping status: Former  Substance and Sexual Activity   Alcohol use: Not Currently   Drug use: No   Sexual activity: Yes    Birth control/protection: Pill  Other Topics Concern   Not on file  Social History Narrative   Not on file   Social Drivers of Health   Tobacco Use: Medium Risk (05/03/2024)   Patient History    Smoking Tobacco Use: Former    Smokeless Tobacco Use: Never    Passive Exposure: Not on Actuary Strain: Low Risk (05/03/2024)   Overall Financial Resource Strain (CARDIA)    Difficulty of Paying Living Expenses: Not hard at all  Food Insecurity: No Food Insecurity (05/03/2024)   Epic    Worried About Radiation Protection Practitioner of Food in the Last Year: Never true    Ran Out of Food in the Last Year: Never true  Transportation Needs: No Transportation Needs (05/03/2024)   Epic    Lack of Transportation (Medical): No    Lack of Transportation (Non-Medical): No  Physical Activity: Insufficiently Active (05/03/2024)   Exercise Vital Sign    Days of Exercise per Week: 2 days    Minutes of Exercise per Session: 30 min  Stress: No Stress Concern Present (05/03/2024)   [employer] of Occupational Health - Occupational Stress Questionnaire    Feeling of Stress: Only a little  Social Connections: Moderately Integrated (05/03/2024)    Social Connection and Isolation Panel    Frequency of Communication with Friends and Family: More than three times a week    Frequency of Social Gatherings with Friends and Family: Three times a week    Attends Religious Services: More than 4 times per year    Active Member of Clubs or Organizations: No    Attends Banker Meetings:  Never    Marital Status: Married  Catering Manager Violence: Not At Risk (05/03/2024)   Epic    Fear of Current or Ex-Partner: No    Emotionally Abused: No    Physically Abused: No    Sexually Abused: No  Depression (PHQ2-9): Low Risk (05/03/2024)   Depression (PHQ2-9)    PHQ-2 Score: 0  Alcohol Screen: Low Risk (05/03/2024)   Alcohol Screen    Last Alcohol Screening Score (AUDIT): 0  Housing: Low Risk (05/03/2024)   Epic    Unable to Pay for Housing in the Last Year: No    Number of Times Moved in the Last Year: 0    Homeless in the Last Year: No  Utilities: Not At Risk (05/03/2024)   Epic    Threatened with loss of utilities: No  Health Literacy: Adequate Health Literacy (05/03/2024)   B1300 Health Literacy    Frequency of need for help with medical instructions: Never   Tobacco Use History[2] Social History   Substance and Sexual Activity  Alcohol Use Not Currently    Family History:  Family History  Problem Relation Age of Onset   Cancer Mother        Lung   Arthritis Mother    Diabetes Mother    Hypertension Mother    Lung cancer Mother    Cancer - Lung Mother    Breast cancer Paternal Grandmother    Bipolar disorder Paternal Grandmother    COPD Neg Hx    Heart disease Neg Hx    Stroke Neg Hx     Past medical history, surgical history, medications, allergies, family history and social history reviewed with patient today and changes made to appropriate areas of the chart.   Review of Systems  Constitutional:  Positive for diaphoresis. Negative for chills, fever, malaise/fatigue and weight loss.  HENT:  Positive  for congestion and sore throat. Negative for ear discharge, ear pain, hearing loss, nosebleeds, sinus pain and tinnitus.   Eyes: Negative.   Respiratory:  Positive for cough. Negative for hemoptysis, sputum production, shortness of breath, wheezing and stridor.   Cardiovascular:  Positive for palpitations. Negative for chest pain, orthopnea, claudication, leg swelling and PND.  Gastrointestinal: Negative.   Genitourinary: Negative.   Musculoskeletal: Negative.   Skin: Negative.   Neurological: Negative.   Endo/Heme/Allergies: Negative.   Psychiatric/Behavioral: Negative.     All other ROS negative except what is listed above and in the HPI.      Objective:    BP 119/80 (BP Location: Left Arm, Cuff Size: Normal)   Pulse 81   Temp 98.4 F (36.9 C) (Oral)   Ht 5' 3 (1.6 m)   Wt 138 lb 12.8 oz (63 kg)   SpO2 95%   BMI 24.59 kg/m   Wt Readings from Last 3 Encounters:  05/03/24 138 lb 12.8 oz (63 kg)  10/28/23 157 lb 12.8 oz (71.6 kg)  07/31/23 157 lb 6.4 oz (71.4 kg)    Physical Exam Vitals and nursing note reviewed.  Constitutional:      General: She is not in acute distress.    Appearance: Normal appearance. She is well-developed. She is not ill-appearing, toxic-appearing or diaphoretic.  HENT:     Head: Normocephalic and atraumatic.     Right Ear: Tympanic membrane, ear canal and external ear normal. There is no impacted cerumen.     Left Ear: Tympanic membrane, ear canal and external ear normal. There is no impacted cerumen.  Nose: Nose normal. No congestion or rhinorrhea.     Mouth/Throat:     Mouth: Mucous membranes are moist.     Pharynx: Oropharynx is clear. No oropharyngeal exudate or posterior oropharyngeal erythema.  Eyes:     General: No scleral icterus.       Right eye: No discharge.        Left eye: No discharge.     Extraocular Movements: Extraocular movements intact.     Conjunctiva/sclera: Conjunctivae normal.     Pupils: Pupils are equal, round, and  reactive to light.  Neck:     Vascular: No carotid bruit.  Cardiovascular:     Rate and Rhythm: Normal rate and regular rhythm.     Pulses: Normal pulses.     Heart sounds: No murmur heard.    No friction rub. No gallop.  Pulmonary:     Effort: Pulmonary effort is normal. No respiratory distress.     Breath sounds: Normal breath sounds. No stridor. No wheezing, rhonchi or rales.  Chest:     Chest wall: No tenderness.  Abdominal:     General: Abdomen is flat. Bowel sounds are normal. There is no distension.     Palpations: Abdomen is soft. There is no mass.     Tenderness: There is no abdominal tenderness. There is no right CVA tenderness, left CVA tenderness, guarding or rebound.     Hernia: No hernia is present.  Genitourinary:    Comments: Breast and pelvic exams deferred with shared decision making Musculoskeletal:        General: No swelling, tenderness, deformity or signs of injury.     Cervical back: Normal range of motion and neck supple. No rigidity. No muscular tenderness.     Right lower leg: No edema.     Left lower leg: No edema.  Lymphadenopathy:     Cervical: No cervical adenopathy.  Skin:    General: Skin is warm and dry.     Capillary Refill: Capillary refill takes less than 2 seconds.     Coloration: Skin is not jaundiced or pale.     Findings: No bruising, erythema, lesion or rash.  Neurological:     General: No focal deficit present.     Mental Status: She is alert and oriented to person, place, and time. Mental status is at baseline.     Cranial Nerves: No cranial nerve deficit.     Sensory: No sensory deficit.     Motor: No weakness.     Coordination: Coordination normal.     Gait: Gait normal.     Deep Tendon Reflexes: Reflexes normal.  Psychiatric:        Mood and Affect: Mood normal.        Behavior: Behavior normal.        Thought Content: Thought content normal.        Judgment: Judgment normal.     Results for orders placed or performed in  visit on 07/31/23  Urine Culture   Collection Time: 07/31/23  4:08 AM   Specimen: Urine   UC  Result Value Ref Range   Urine Culture, Routine Final report (A)    Organism ID, Bacteria Escherichia coli (A)    Antimicrobial Susceptibility Comment   Microscopic Examination   Collection Time: 07/31/23  3:23 PM   Urine  Result Value Ref Range   WBC, UA >30 (A) 0 - 5 /hpf   RBC, Urine None seen 0 - 2 /hpf   Epithelial  Cells (non renal) 0-10 0 - 10 /hpf   Bacteria, UA Moderate (A) None seen/Few  Urinalysis, Routine w reflex microscopic   Collection Time: 07/31/23  3:23 PM  Result Value Ref Range   Specific Gravity, UA 1.020 1.005 - 1.030   pH, UA 5.5 5.0 - 7.5   Color, UA Yellow Yellow   Appearance Ur Cloudy (A) Clear   Leukocytes,UA 2+ (A) Negative   Protein,UA Negative Negative/Trace   Glucose, UA Negative Negative   Ketones, UA Negative Negative   RBC, UA 2+ (A) Negative   Bilirubin, UA Negative Negative   Urobilinogen, Ur 0.2 0.2 - 1.0 mg/dL   Nitrite, UA Positive (A) Negative   Microscopic Examination See below:       Assessment & Plan:   Problem List Items Addressed This Visit       Genitourinary   Genital herpes simplex   Under good control on current regimen. Continue current regimen. Continue to monitor. Call with any concerns. Refills given.        Relevant Medications   azithromycin  (ZITHROMAX ) 250 MG tablet   valACYclovir  (VALTREX ) 1000 MG tablet     Other   Depression, major, recurrent, in remission   Under good control on current regimen. Continue current regimen. Continue to monitor. Call with any concerns. Refills given.        Relevant Medications   buPROPion  (WELLBUTRIN  XL) 300 MG 24 hr tablet   busPIRone  (BUSPAR ) 5 MG tablet   citalopram  (CELEXA ) 20 MG tablet   Other Visit Diagnoses       Routine general medical examination at a health care facility    -  Primary   Vaccines up to date. Screening labs checked today. Pap and colonoscopy up to  date. Mammogram ordered today. Continue diet and exercise. Call with any concerns.   Relevant Orders   CBC with Differential/Platelet   Comprehensive metabolic panel with GFR   Lipid Panel w/o Chol/HDL Ratio   TSH   Hepatitis B surface antibody,quantitative     Acute bronchitis, unspecified organism       Will treat with prednisone  taper and azithromycin . Call if not getting better or getting worse. Continue to monitor.   Relevant Medications   methylPREDNISolone  sodium succinate (SOLU-MEDROL ) 40 mg/mL injection 40 mg (Completed)     Encounter for screening mammogram for malignant neoplasm of breast       Mammogram ordered today.   Relevant Orders   MM 3D SCREENING MAMMOGRAM BILATERAL BREAST     Wheezing       Relevant Medications   albuterol  (VENTOLIN  HFA) 108 (90 Base) MCG/ACT inhaler        Follow up plan: Return in about 6 months (around 11/01/2024).   LABORATORY TESTING:  - Pap smear: up to date  IMMUNIZATIONS:   - Tdap: Tetanus vaccination status reviewed: last tetanus booster within 10 years. - Influenza: will return for when not sick - Prevnar: Refused - COVID: Refused - HPV: Not applicable - Shingrix vaccine: Not applicable  SCREENING: -Mammogram: Up to date  - Colonoscopy: Up to date   PATIENT COUNSELING:   Advised to take 1 mg of folate supplement per day if capable of pregnancy.   Sexuality: Discussed sexually transmitted diseases, partner selection, use of condoms, avoidance of unintended pregnancy  and contraceptive alternatives.   Advised to avoid cigarette smoking.  I discussed with the patient that most people either abstain from alcohol or drink within safe limits (<=14/week and <=4 drinks/occasion for  males, <=7/weeks and <= 3 drinks/occasion for females) and that the risk for alcohol disorders and other health effects rises proportionally with the number of drinks per week and how often a drinker exceeds daily limits.  Discussed cessation/primary  prevention of drug use and availability of treatment for abuse.   Diet: Encouraged to adjust caloric intake to maintain  or achieve ideal body weight, to reduce intake of dietary saturated fat and total fat, to limit sodium intake by avoiding high sodium foods and not adding table salt, and to maintain adequate dietary potassium and calcium preferably from fresh fruits, vegetables, and low-fat dairy products.    stressed the importance of regular exercise  Injury prevention: Discussed safety belts, safety helmets, smoke detector, smoking near bedding or upholstery.   Dental health: Discussed importance of regular tooth brushing, flossing, and dental visits.    NEXT PREVENTATIVE PHYSICAL DUE IN 1 YEAR. Return in about 6 months (around 11/01/2024).               [1]  Allergies Allergen Reactions   Percocet [Oxycodone-Acetaminophen] Nausea And Vomiting  [2]  Social History Tobacco Use  Smoking Status Former   Current packs/day: 0.00   Average packs/day: 1 pack/day for 15.0 years (15.0 ttl pk-yrs)   Types: Cigarettes   Start date: 05/20/1987   Quit date: 05/19/2002   Years since quitting: 21.9  Smokeless Tobacco Never

## 2024-05-03 NOTE — Assessment & Plan Note (Signed)
 Under good control on current regimen. Continue current regimen. Continue to monitor. Call with any concerns. Refills given.

## 2024-05-04 LAB — COMPREHENSIVE METABOLIC PANEL WITH GFR
ALT: 395 IU/L — ABNORMAL HIGH (ref 0–32)
AST: 156 IU/L — ABNORMAL HIGH (ref 0–40)
Albumin: 4.2 g/dL (ref 3.9–4.9)
Alkaline Phosphatase: 100 IU/L (ref 41–116)
BUN/Creatinine Ratio: 11 (ref 9–23)
BUN: 9 mg/dL (ref 6–24)
Bilirubin Total: 0.4 mg/dL (ref 0.0–1.2)
CO2: 20 mmol/L (ref 20–29)
Calcium: 9.7 mg/dL (ref 8.7–10.2)
Chloride: 104 mmol/L (ref 96–106)
Creatinine, Ser: 0.84 mg/dL (ref 0.57–1.00)
Globulin, Total: 2.4 g/dL (ref 1.5–4.5)
Glucose: 70 mg/dL (ref 70–99)
Potassium: 3.8 mmol/L (ref 3.5–5.2)
Sodium: 141 mmol/L (ref 134–144)
Total Protein: 6.6 g/dL (ref 6.0–8.5)
eGFR: 86 mL/min/1.73 (ref >59)

## 2024-05-04 LAB — CBC WITH DIFFERENTIAL/PLATELET
Basophils Absolute: 0.2 x10E3/uL (ref 0.0–0.2)
Basos: 1 %
EOS (ABSOLUTE): 0.5 x10E3/uL — ABNORMAL HIGH (ref 0.0–0.4)
Eos: 3 %
Hematocrit: 42.9 % (ref 34.0–46.6)
Hemoglobin: 13.9 g/dL (ref 11.1–15.9)
Immature Grans (Abs): 0.1 x10E3/uL (ref 0.0–0.1)
Immature Granulocytes: 1 %
Lymphocytes Absolute: 3.2 x10E3/uL — ABNORMAL HIGH (ref 0.7–3.1)
Lymphs: 20 %
MCH: 32.4 pg (ref 26.6–33.0)
MCHC: 32.4 g/dL (ref 31.5–35.7)
MCV: 100 fL — ABNORMAL HIGH (ref 79–97)
Monocytes Absolute: 1 x10E3/uL — ABNORMAL HIGH (ref 0.1–0.9)
Monocytes: 6 %
Neutrophils Absolute: 11.2 x10E3/uL — ABNORMAL HIGH (ref 1.4–7.0)
Neutrophils: 69 %
Platelets: 483 x10E3/uL — ABNORMAL HIGH (ref 150–450)
RBC: 4.29 x10E6/uL (ref 3.77–5.28)
RDW: 12.1 % (ref 11.7–15.4)
WBC: 16.2 x10E3/uL — ABNORMAL HIGH (ref 3.4–10.8)

## 2024-05-04 LAB — HEPATITIS B SURFACE ANTIBODY, QUANTITATIVE: Hepatitis B Surf Ab Quant: <3.5 m[IU]/mL — ABNORMAL LOW

## 2024-05-04 LAB — LIPID PANEL W/O CHOL/HDL RATIO
Cholesterol, Total: 229 mg/dL — ABNORMAL HIGH (ref 100–199)
HDL: 64 mg/dL (ref >39)
LDL Chol Calc (NIH): 148 mg/dL — ABNORMAL HIGH (ref 0–99)
Triglycerides: 98 mg/dL (ref 0–149)
VLDL Cholesterol Cal: 17 mg/dL (ref 5–40)

## 2024-05-04 LAB — TSH: TSH: 1.03 u[IU]/mL (ref 0.450–4.500)

## 2024-05-05 ENCOUNTER — Other Ambulatory Visit: Payer: Self-pay | Admitting: Family Medicine

## 2024-05-05 ENCOUNTER — Ambulatory Visit: Payer: Self-pay | Admitting: Family Medicine

## 2024-05-05 DIAGNOSIS — R748 Abnormal levels of other serum enzymes: Secondary | ICD-10-CM

## 2024-05-05 NOTE — Telephone Encounter (Unsigned)
 Copied from CRM #[id number]. Topic: Clinical - Lab/Test Results >> May 05, 2024  3:58 PM Emylou G wrote: Reason for CRM: Patient returned Drs call back.. she said: She has been taking alot of tylenol and ibuprophen because she has been sick.. she has started to drink again but not a concern to take naltrexone  again.. she doesn't want to do hep b shot yet.  If you have questions.. she said to please call her back but she is okay at this time

## 2024-06-02 ENCOUNTER — Other Ambulatory Visit: Payer: Self-pay | Admitting: Family Medicine

## 2024-06-02 MED ORDER — NALTREXONE HCL 50 MG PO TABS: 50.0000 mg | ORAL_TABLET | Freq: Every day | ORAL | 3 refills | Status: AC
